# Patient Record
Sex: Female | Born: 1945
Health system: Southern US, Community
[De-identification: ages and names within clinical notes are randomized; demographics above are authoritative.]

## PROBLEM LIST (undated history)

## (undated) DIAGNOSIS — I10 Essential (primary) hypertension: Secondary | ICD-10-CM

## (undated) HISTORY — PX: ABDOMINAL HYSTERECTOMY: SHX81

## (undated) HISTORY — DX: Essential (primary) hypertension: I10

## (undated) HISTORY — PX: PARTIAL HYSTERECTOMY: SHX80

---

## 1998-10-01 ENCOUNTER — Other Ambulatory Visit: Admission: RE | Admit: 1998-10-01 | Discharge: 1998-10-01 | Payer: Self-pay | Admitting: Obstetrics & Gynecology

## 1999-11-07 ENCOUNTER — Other Ambulatory Visit: Admission: RE | Admit: 1999-11-07 | Discharge: 1999-11-07 | Payer: Self-pay | Admitting: Obstetrics & Gynecology

## 2000-09-14 ENCOUNTER — Encounter: Admission: RE | Admit: 2000-09-14 | Discharge: 2000-09-14 | Payer: Self-pay | Admitting: Family Medicine

## 2000-09-14 ENCOUNTER — Encounter: Payer: Self-pay | Admitting: Family Medicine

## 2000-12-25 ENCOUNTER — Encounter: Admission: RE | Admit: 2000-12-25 | Discharge: 2000-12-25 | Payer: Self-pay | Admitting: Family Medicine

## 2000-12-25 ENCOUNTER — Encounter: Payer: Self-pay | Admitting: Family Medicine

## 2001-01-09 ENCOUNTER — Other Ambulatory Visit: Admission: RE | Admit: 2001-01-09 | Discharge: 2001-01-09 | Payer: Self-pay | Admitting: Obstetrics & Gynecology

## 2001-02-28 ENCOUNTER — Encounter: Admission: RE | Admit: 2001-02-28 | Discharge: 2001-02-28 | Payer: Self-pay | Admitting: Gastroenterology

## 2001-02-28 ENCOUNTER — Encounter: Payer: Self-pay | Admitting: Gastroenterology

## 2001-06-17 ENCOUNTER — Encounter: Payer: Self-pay | Admitting: *Deleted

## 2001-06-17 ENCOUNTER — Encounter: Admission: RE | Admit: 2001-06-17 | Discharge: 2001-06-17 | Payer: Self-pay | Admitting: *Deleted

## 2002-02-24 ENCOUNTER — Encounter: Payer: Self-pay | Admitting: Obstetrics & Gynecology

## 2002-02-24 ENCOUNTER — Encounter: Admission: RE | Admit: 2002-02-24 | Discharge: 2002-02-24 | Payer: Self-pay | Admitting: Obstetrics & Gynecology

## 2002-03-06 ENCOUNTER — Other Ambulatory Visit: Admission: RE | Admit: 2002-03-06 | Discharge: 2002-03-06 | Payer: Self-pay | Admitting: Obstetrics & Gynecology

## 2003-01-07 ENCOUNTER — Ambulatory Visit (HOSPITAL_COMMUNITY): Admission: RE | Admit: 2003-01-07 | Discharge: 2003-01-07 | Payer: Self-pay | Admitting: Gastroenterology

## 2003-07-29 ENCOUNTER — Encounter: Admission: RE | Admit: 2003-07-29 | Discharge: 2003-07-29 | Payer: Self-pay | Admitting: Obstetrics & Gynecology

## 2003-07-29 ENCOUNTER — Encounter: Payer: Self-pay | Admitting: Obstetrics & Gynecology

## 2003-09-25 ENCOUNTER — Other Ambulatory Visit: Admission: RE | Admit: 2003-09-25 | Discharge: 2003-09-25 | Payer: Self-pay | Admitting: Obstetrics & Gynecology

## 2004-12-07 ENCOUNTER — Other Ambulatory Visit: Admission: RE | Admit: 2004-12-07 | Discharge: 2004-12-07 | Payer: Self-pay | Admitting: Obstetrics & Gynecology

## 2006-02-26 ENCOUNTER — Encounter: Admission: RE | Admit: 2006-02-26 | Discharge: 2006-02-26 | Payer: Self-pay | Admitting: Obstetrics & Gynecology

## 2006-03-06 ENCOUNTER — Other Ambulatory Visit: Admission: RE | Admit: 2006-03-06 | Discharge: 2006-03-06 | Payer: Self-pay | Admitting: Obstetrics & Gynecology

## 2006-03-20 ENCOUNTER — Encounter: Admission: RE | Admit: 2006-03-20 | Discharge: 2006-03-20 | Payer: Self-pay | Admitting: Obstetrics & Gynecology

## 2006-10-01 ENCOUNTER — Encounter: Admission: RE | Admit: 2006-10-01 | Discharge: 2006-10-01 | Payer: Self-pay | Admitting: Family Medicine

## 2007-07-08 ENCOUNTER — Encounter: Admission: RE | Admit: 2007-07-08 | Discharge: 2007-07-08 | Payer: Self-pay | Admitting: Family Medicine

## 2008-06-16 ENCOUNTER — Encounter: Admission: RE | Admit: 2008-06-16 | Discharge: 2008-06-16 | Payer: Self-pay | Admitting: Family Medicine

## 2009-03-03 ENCOUNTER — Encounter: Admission: RE | Admit: 2009-03-03 | Discharge: 2009-03-03 | Payer: Self-pay | Admitting: Obstetrics & Gynecology

## 2009-05-07 ENCOUNTER — Encounter: Admission: RE | Admit: 2009-05-07 | Discharge: 2009-05-07 | Payer: Self-pay | Admitting: Family Medicine

## 2009-06-16 ENCOUNTER — Encounter: Admission: RE | Admit: 2009-06-16 | Discharge: 2009-06-16 | Payer: Self-pay | Admitting: Obstetrics & Gynecology

## 2009-11-24 ENCOUNTER — Encounter: Admission: RE | Admit: 2009-11-24 | Discharge: 2009-11-24 | Payer: Self-pay | Admitting: Family Medicine

## 2010-06-07 ENCOUNTER — Encounter: Admission: RE | Admit: 2010-06-07 | Discharge: 2010-06-07 | Payer: Self-pay | Admitting: Family Medicine

## 2010-06-09 ENCOUNTER — Encounter: Admission: RE | Admit: 2010-06-09 | Discharge: 2010-06-09 | Payer: Self-pay | Admitting: Family Medicine

## 2010-12-18 ENCOUNTER — Encounter: Payer: Self-pay | Admitting: Obstetrics & Gynecology

## 2011-04-14 NOTE — Op Note (Signed)
NAME:  Jenny Mitchell, Jenny Mitchell                    ACCOUNT NO.:  [id number]   MEDICAL RECORD NO.:  [id number]                   PATIENT TYPE:  AMB   LOCATION:  ENDO                                 FACILITY:  [facility]   PHYSICIAN:  Anselmo Rod, M.D.               DATE OF BIRTH:  09-30-1946   DATE OF PROCEDURE:  01/07/2003  DATE OF DISCHARGE:                                 OPERATIVE REPORT   PROCEDURE PERFORMED:  Colonoscopy.   ENDOSCOPIST:  Charna Elizabeth, M.D.   INSTRUMENT USED:  Olympus video colonoscope (adjustable pediatric scope).   INDICATIONS FOR PROCEDURE:  The patient is a 65 year old African-American  female with a family history of colon cancer in her father and polyps in her  sister.  Rule out colonic polyps, masses, etc.   PREPROCEDURE PREPARATION:  Informed consent was procured from the patient.  The patient was fasted for eight hours prior to the procedure and prepped  with a bottle of magnesium citrate and a gallon of GoLYTELY the night prior  to the procedure.   PREPROCEDURE PHYSICAL:  The patient had stable vital signs.  Neck supple.  Chest clear to auscultation.  S1 and S2 regular.  Abdomen soft with normal  bowel sounds.   DESCRIPTION OF PROCEDURE:  The patient was placed in left lateral decubitus  position and sedated with an additional 20 mg of Demerol and 2 mg of Versed  intravenously.  Once the patient was adequately sedated and maintained on  low flow oxygen and continuous cardiac monitoring, the Olympus video  colonoscope was advanced from the rectum to the cecum without difficulty.  The entire colonic mucosa appeared healthy with a normal vascular pattern.  No masses, erosions, polyps, or ulcerations were seen and there was no  evidence of diverticulosis.  Small internal hemorrhoids were noticed on  retroflexion.  A small external hemorrhoid was seen on anal inspection.  The  patient tolerated the procedure well without complications.  No masses or  polyps were seen.   IMPRESSION:  Normal colonoscopy except for small nonbleeding internal  hemorrhoids and a small external hemorrhoid.   RECOMMENDATIONS:  1. A high fiber diet with liberal fluid intake has been advocated.  2.     Repeat colorectal cancer screening is recommended in the next five years     unless the patient develops any abnormal symptoms in the interim.  3. Outpatient follow-up in the next two weeks for further recommendations.                                                    Anselmo Rod, M.D.    JNM/MEDQ  D:  01/07/2003  T:  01/07/2003  Job:  [id number]   cc:   Juline Patch, M.D.  [address] [place]  [place], [place] [address]  Fax: [phone]

## 2011-04-14 NOTE — Op Note (Signed)
   NAME:  Jenny Mitchell, Jenny Mitchell                    ACCOUNT NO.:  [id number]   MEDICAL RECORD NO.:  [id number]                   PATIENT TYPE:  AMB   LOCATION:  ENDO                                 FACILITY:  [facility]   PHYSICIAN:  Anselmo Rod, M.D.               DATE OF BIRTH:  August 04, 1946   DATE OF PROCEDURE:  DATE OF DISCHARGE:                                 OPERATIVE REPORT   PROCEDURE PERFORMED:  Esophagogastroduodenoscopy.   ENDOSCOPIST:  Charna Elizabeth, M.D.   INSTRUMENT USED:  Olympus video pan endoscope.   INDICATIONS FOR PROCEDURE:  Epigastric pain ina  65 year old, African-  American female, rule out peptic ulcer disease, esophagitis, gastritis, etc.   PRE-PROCEDURE PREPARATION:  Informed consent was secured from the patient.  The patient fasted for eight hours prior to the procedure.   PRE-PROCEDURE PHYSICAL:  The patient has stable vital signs.  The neck was  supple.  Chest was clear to auscultation.  S1 and S2 regular.  Abdomen:  Soft, but normal bowel sounds.   DESCRIPTION OF PROCEDURE:  The patient was placed in left lateral decubitus  position, sedated with 50 mg of Demerol and 5 mg of Versed intravenously.  Once the patient was adequately sedated and maintained on low flow oxygen,  continuous cardiac monitoring, the Olympus video pan endoscope was advanced  through the mouth piece, over the tongue, into the esophagus under direct  vision.  The entire esophagus appeared normal with no evidence of ring  stricture, masses, esophagitis or varus.  The colonoscope was then advanced  into the stomach.  The entire gastric mucosa and proximal stomach appeared  normal.   IMPRESSION:  Normal EGD.   RECOMMENDATIONS:  Proceed with colonoscopy at this time.                                               Anselmo Rod, M.D.    JNM/MEDQ  D:  01/07/2003  T:  01/07/2003  Job:  [id number]   cc:   Juline Patch, M.D.  [address]  [place], [place] [address]  Fax:  [phone]

## 2011-07-04 ENCOUNTER — Other Ambulatory Visit: Payer: Self-pay | Admitting: Family Medicine

## 2011-07-04 DIAGNOSIS — Z1231 Encounter for screening mammogram for malignant neoplasm of breast: Secondary | ICD-10-CM

## 2011-07-11 ENCOUNTER — Ambulatory Visit
Admission: RE | Admit: 2011-07-11 | Discharge: 2011-07-11 | Disposition: A | Discharge status: Home / Self Care | Payer: Federal, State, Local not specified - PPO | Source: Ambulatory Visit | Attending: Family Medicine | Admitting: Family Medicine

## 2011-07-11 DIAGNOSIS — Z1231 Encounter for screening mammogram for malignant neoplasm of breast: Secondary | ICD-10-CM

## 2011-07-13 ENCOUNTER — Other Ambulatory Visit: Payer: Self-pay | Admitting: Family Medicine

## 2011-07-13 DIAGNOSIS — R928 Other abnormal and inconclusive findings on diagnostic imaging of breast: Secondary | ICD-10-CM

## 2011-07-21 ENCOUNTER — Ambulatory Visit
Admission: RE | Admit: 2011-07-21 | Discharge: 2011-07-21 | Disposition: A | Discharge status: Home / Self Care | Payer: Federal, State, Local not specified - PPO | Source: Ambulatory Visit | Attending: Family Medicine | Admitting: Family Medicine

## 2011-07-21 DIAGNOSIS — R928 Other abnormal and inconclusive findings on diagnostic imaging of breast: Secondary | ICD-10-CM

## 2011-11-25 ENCOUNTER — Ambulatory Visit (INDEPENDENT_AMBULATORY_CARE_PROVIDER_SITE_OTHER): Payer: Federal, State, Local not specified - PPO

## 2011-11-25 DIAGNOSIS — M542 Cervicalgia: Secondary | ICD-10-CM

## 2011-11-25 DIAGNOSIS — R5383 Other fatigue: Secondary | ICD-10-CM

## 2011-11-25 DIAGNOSIS — R51 Headache: Secondary | ICD-10-CM

## 2011-11-25 DIAGNOSIS — H02409 Unspecified ptosis of unspecified eyelid: Secondary | ICD-10-CM

## 2011-11-25 DIAGNOSIS — R5381 Other malaise: Secondary | ICD-10-CM

## 2011-11-29 DIAGNOSIS — R51 Headache: Secondary | ICD-10-CM | POA: Diagnosis not present

## 2012-01-03 DIAGNOSIS — M722 Plantar fascial fibromatosis: Secondary | ICD-10-CM | POA: Diagnosis not present

## 2012-01-26 DIAGNOSIS — M159 Polyosteoarthritis, unspecified: Secondary | ICD-10-CM | POA: Diagnosis not present

## 2012-01-26 DIAGNOSIS — I10 Essential (primary) hypertension: Secondary | ICD-10-CM | POA: Diagnosis not present

## 2012-02-15 DIAGNOSIS — H35379 Puckering of macula, unspecified eye: Secondary | ICD-10-CM | POA: Diagnosis not present

## 2012-02-15 DIAGNOSIS — D492 Neoplasm of unspecified behavior of bone, soft tissue, and skin: Secondary | ICD-10-CM | POA: Diagnosis not present

## 2012-02-15 DIAGNOSIS — H26499 Other secondary cataract, unspecified eye: Secondary | ICD-10-CM | POA: Diagnosis not present

## 2012-02-15 DIAGNOSIS — H40019 Open angle with borderline findings, low risk, unspecified eye: Secondary | ICD-10-CM | POA: Diagnosis not present

## 2012-08-06 DIAGNOSIS — Z23 Encounter for immunization: Secondary | ICD-10-CM | POA: Diagnosis not present

## 2012-08-06 DIAGNOSIS — I10 Essential (primary) hypertension: Secondary | ICD-10-CM | POA: Diagnosis not present

## 2012-08-06 DIAGNOSIS — M722 Plantar fascial fibromatosis: Secondary | ICD-10-CM | POA: Diagnosis not present

## 2012-08-06 DIAGNOSIS — K219 Gastro-esophageal reflux disease without esophagitis: Secondary | ICD-10-CM | POA: Diagnosis not present

## 2012-08-06 DIAGNOSIS — M159 Polyosteoarthritis, unspecified: Secondary | ICD-10-CM | POA: Diagnosis not present

## 2012-09-10 DIAGNOSIS — Z1212 Encounter for screening for malignant neoplasm of rectum: Secondary | ICD-10-CM | POA: Diagnosis not present

## 2012-09-10 DIAGNOSIS — R000 Tachycardia, unspecified: Secondary | ICD-10-CM | POA: Diagnosis not present

## 2012-09-10 DIAGNOSIS — I10 Essential (primary) hypertension: Secondary | ICD-10-CM | POA: Diagnosis not present

## 2012-09-10 DIAGNOSIS — Z124 Encounter for screening for malignant neoplasm of cervix: Secondary | ICD-10-CM | POA: Diagnosis not present

## 2012-09-10 DIAGNOSIS — R809 Proteinuria, unspecified: Secondary | ICD-10-CM | POA: Diagnosis not present

## 2012-09-10 DIAGNOSIS — K219 Gastro-esophageal reflux disease without esophagitis: Secondary | ICD-10-CM | POA: Diagnosis not present

## 2012-09-11 ENCOUNTER — Other Ambulatory Visit: Payer: Self-pay | Admitting: Obstetrics & Gynecology

## 2012-09-11 DIAGNOSIS — Z1231 Encounter for screening mammogram for malignant neoplasm of breast: Secondary | ICD-10-CM

## 2012-10-04 ENCOUNTER — Ambulatory Visit
Admission: RE | Admit: 2012-10-04 | Discharge: 2012-10-04 | Disposition: A | Discharge status: Home / Self Care | Payer: Medicare Other | Source: Ambulatory Visit | Attending: Obstetrics & Gynecology | Admitting: Obstetrics & Gynecology

## 2012-10-04 DIAGNOSIS — Z1231 Encounter for screening mammogram for malignant neoplasm of breast: Secondary | ICD-10-CM | POA: Diagnosis not present

## 2012-10-09 ENCOUNTER — Other Ambulatory Visit: Payer: Self-pay | Admitting: Obstetrics & Gynecology

## 2012-10-09 DIAGNOSIS — R928 Other abnormal and inconclusive findings on diagnostic imaging of breast: Secondary | ICD-10-CM

## 2012-10-21 ENCOUNTER — Ambulatory Visit
Admission: RE | Admit: 2012-10-21 | Discharge: 2012-10-21 | Disposition: A | Discharge status: Home / Self Care | Payer: Medicare Other | Source: Ambulatory Visit | Attending: Obstetrics & Gynecology | Admitting: Obstetrics & Gynecology

## 2012-10-21 DIAGNOSIS — N6019 Diffuse cystic mastopathy of unspecified breast: Secondary | ICD-10-CM | POA: Diagnosis not present

## 2012-10-21 DIAGNOSIS — R928 Other abnormal and inconclusive findings on diagnostic imaging of breast: Secondary | ICD-10-CM

## 2012-12-30 DIAGNOSIS — Z961 Presence of intraocular lens: Secondary | ICD-10-CM | POA: Diagnosis not present

## 2012-12-30 DIAGNOSIS — H43819 Vitreous degeneration, unspecified eye: Secondary | ICD-10-CM | POA: Diagnosis not present

## 2012-12-30 DIAGNOSIS — H40019 Open angle with borderline findings, low risk, unspecified eye: Secondary | ICD-10-CM | POA: Diagnosis not present

## 2012-12-30 DIAGNOSIS — H35379 Puckering of macula, unspecified eye: Secondary | ICD-10-CM | POA: Diagnosis not present

## 2012-12-30 DIAGNOSIS — H26499 Other secondary cataract, unspecified eye: Secondary | ICD-10-CM | POA: Diagnosis not present

## 2013-02-03 DIAGNOSIS — I10 Essential (primary) hypertension: Secondary | ICD-10-CM | POA: Diagnosis not present

## 2013-02-03 DIAGNOSIS — M722 Plantar fascial fibromatosis: Secondary | ICD-10-CM | POA: Diagnosis not present

## 2013-02-03 DIAGNOSIS — M159 Polyosteoarthritis, unspecified: Secondary | ICD-10-CM | POA: Diagnosis not present

## 2013-02-03 DIAGNOSIS — K219 Gastro-esophageal reflux disease without esophagitis: Secondary | ICD-10-CM | POA: Diagnosis not present

## 2013-02-17 DIAGNOSIS — H01009 Unspecified blepharitis unspecified eye, unspecified eyelid: Secondary | ICD-10-CM | POA: Diagnosis not present

## 2013-02-17 DIAGNOSIS — H43819 Vitreous degeneration, unspecified eye: Secondary | ICD-10-CM | POA: Diagnosis not present

## 2013-02-17 DIAGNOSIS — H26499 Other secondary cataract, unspecified eye: Secondary | ICD-10-CM | POA: Diagnosis not present

## 2013-02-17 DIAGNOSIS — H40019 Open angle with borderline findings, low risk, unspecified eye: Secondary | ICD-10-CM | POA: Diagnosis not present

## 2013-02-17 DIAGNOSIS — H35039 Hypertensive retinopathy, unspecified eye: Secondary | ICD-10-CM | POA: Diagnosis not present

## 2013-08-04 DIAGNOSIS — H1045 Other chronic allergic conjunctivitis: Secondary | ICD-10-CM | POA: Diagnosis not present

## 2013-08-04 DIAGNOSIS — H01009 Unspecified blepharitis unspecified eye, unspecified eyelid: Secondary | ICD-10-CM | POA: Diagnosis not present

## 2013-08-04 DIAGNOSIS — H40019 Open angle with borderline findings, low risk, unspecified eye: Secondary | ICD-10-CM | POA: Diagnosis not present

## 2013-08-06 DIAGNOSIS — R42 Dizziness and giddiness: Secondary | ICD-10-CM | POA: Diagnosis not present

## 2013-08-06 DIAGNOSIS — M159 Polyosteoarthritis, unspecified: Secondary | ICD-10-CM | POA: Diagnosis not present

## 2013-08-06 DIAGNOSIS — I10 Essential (primary) hypertension: Secondary | ICD-10-CM | POA: Diagnosis not present

## 2013-08-06 DIAGNOSIS — R809 Proteinuria, unspecified: Secondary | ICD-10-CM | POA: Diagnosis not present

## 2013-08-06 DIAGNOSIS — M722 Plantar fascial fibromatosis: Secondary | ICD-10-CM | POA: Diagnosis not present

## 2013-09-09 DIAGNOSIS — Z800 Family history of malignant neoplasm of digestive organs: Secondary | ICD-10-CM | POA: Diagnosis not present

## 2013-09-09 DIAGNOSIS — R197 Diarrhea, unspecified: Secondary | ICD-10-CM | POA: Diagnosis not present

## 2013-09-09 DIAGNOSIS — R141 Gas pain: Secondary | ICD-10-CM | POA: Diagnosis not present

## 2013-09-09 DIAGNOSIS — Z1211 Encounter for screening for malignant neoplasm of colon: Secondary | ICD-10-CM | POA: Diagnosis not present

## 2013-11-03 DIAGNOSIS — M810 Age-related osteoporosis without current pathological fracture: Secondary | ICD-10-CM | POA: Diagnosis not present

## 2013-11-03 DIAGNOSIS — Z1231 Encounter for screening mammogram for malignant neoplasm of breast: Secondary | ICD-10-CM | POA: Diagnosis not present

## 2013-11-03 DIAGNOSIS — Z01419 Encounter for gynecological examination (general) (routine) without abnormal findings: Secondary | ICD-10-CM | POA: Diagnosis not present

## 2013-11-03 DIAGNOSIS — N959 Unspecified menopausal and perimenopausal disorder: Secondary | ICD-10-CM | POA: Diagnosis not present

## 2013-11-10 DIAGNOSIS — Z1211 Encounter for screening for malignant neoplasm of colon: Secondary | ICD-10-CM | POA: Diagnosis not present

## 2013-11-10 DIAGNOSIS — D126 Benign neoplasm of colon, unspecified: Secondary | ICD-10-CM | POA: Diagnosis not present

## 2013-11-10 DIAGNOSIS — Z800 Family history of malignant neoplasm of digestive organs: Secondary | ICD-10-CM | POA: Diagnosis not present

## 2013-12-11 DIAGNOSIS — R7401 Elevation of levels of liver transaminase levels: Secondary | ICD-10-CM | POA: Diagnosis not present

## 2014-02-05 DIAGNOSIS — I10 Essential (primary) hypertension: Secondary | ICD-10-CM | POA: Diagnosis not present

## 2014-02-05 DIAGNOSIS — M722 Plantar fascial fibromatosis: Secondary | ICD-10-CM | POA: Diagnosis not present

## 2014-08-13 DIAGNOSIS — M722 Plantar fascial fibromatosis: Secondary | ICD-10-CM | POA: Diagnosis not present

## 2014-08-13 DIAGNOSIS — I10 Essential (primary) hypertension: Secondary | ICD-10-CM | POA: Diagnosis not present

## 2014-08-13 DIAGNOSIS — R809 Proteinuria, unspecified: Secondary | ICD-10-CM | POA: Diagnosis not present

## 2014-11-24 DIAGNOSIS — Z1231 Encounter for screening mammogram for malignant neoplasm of breast: Secondary | ICD-10-CM | POA: Diagnosis not present

## 2014-11-24 DIAGNOSIS — Z124 Encounter for screening for malignant neoplasm of cervix: Secondary | ICD-10-CM | POA: Diagnosis not present

## 2014-11-24 DIAGNOSIS — Z01419 Encounter for gynecological examination (general) (routine) without abnormal findings: Secondary | ICD-10-CM | POA: Diagnosis not present

## 2014-11-24 DIAGNOSIS — Z1272 Encounter for screening for malignant neoplasm of vagina: Secondary | ICD-10-CM | POA: Diagnosis not present

## 2015-02-11 DIAGNOSIS — M722 Plantar fascial fibromatosis: Secondary | ICD-10-CM | POA: Diagnosis not present

## 2015-02-11 DIAGNOSIS — I10 Essential (primary) hypertension: Secondary | ICD-10-CM | POA: Diagnosis not present

## 2015-02-11 DIAGNOSIS — Z23 Encounter for immunization: Secondary | ICD-10-CM | POA: Diagnosis not present

## 2015-02-11 DIAGNOSIS — R809 Proteinuria, unspecified: Secondary | ICD-10-CM | POA: Diagnosis not present

## 2015-02-11 DIAGNOSIS — M810 Age-related osteoporosis without current pathological fracture: Secondary | ICD-10-CM | POA: Diagnosis not present

## 2015-06-15 DIAGNOSIS — H01003 Unspecified blepharitis right eye, unspecified eyelid: Secondary | ICD-10-CM | POA: Diagnosis not present

## 2015-06-15 DIAGNOSIS — Z961 Presence of intraocular lens: Secondary | ICD-10-CM | POA: Diagnosis not present

## 2015-06-15 DIAGNOSIS — H40013 Open angle with borderline findings, low risk, bilateral: Secondary | ICD-10-CM | POA: Diagnosis not present

## 2015-06-15 DIAGNOSIS — H35033 Hypertensive retinopathy, bilateral: Secondary | ICD-10-CM | POA: Diagnosis not present

## 2015-08-26 DIAGNOSIS — R809 Proteinuria, unspecified: Secondary | ICD-10-CM | POA: Diagnosis not present

## 2015-08-26 DIAGNOSIS — I10 Essential (primary) hypertension: Secondary | ICD-10-CM | POA: Diagnosis not present

## 2015-08-26 DIAGNOSIS — M810 Age-related osteoporosis without current pathological fracture: Secondary | ICD-10-CM | POA: Diagnosis not present

## 2015-08-26 DIAGNOSIS — M722 Plantar fascial fibromatosis: Secondary | ICD-10-CM | POA: Diagnosis not present

## 2015-10-07 DIAGNOSIS — H0289 Other specified disorders of eyelid: Secondary | ICD-10-CM | POA: Diagnosis not present

## 2015-10-07 DIAGNOSIS — H40013 Open angle with borderline findings, low risk, bilateral: Secondary | ICD-10-CM | POA: Diagnosis not present

## 2015-12-07 DIAGNOSIS — Z1231 Encounter for screening mammogram for malignant neoplasm of breast: Secondary | ICD-10-CM | POA: Diagnosis not present

## 2015-12-07 DIAGNOSIS — Z01419 Encounter for gynecological examination (general) (routine) without abnormal findings: Secondary | ICD-10-CM | POA: Diagnosis not present

## 2015-12-07 DIAGNOSIS — Z6822 Body mass index (BMI) 22.0-22.9, adult: Secondary | ICD-10-CM | POA: Diagnosis not present

## 2016-01-06 DIAGNOSIS — J069 Acute upper respiratory infection, unspecified: Secondary | ICD-10-CM | POA: Diagnosis not present

## 2016-01-19 ENCOUNTER — Ambulatory Visit
Admission: RE | Admit: 2016-01-19 | Discharge: 2016-01-19 | Disposition: A | Discharge status: Home / Self Care | Payer: Medicare Other | Source: Ambulatory Visit | Attending: Family Medicine | Admitting: Family Medicine

## 2016-01-19 ENCOUNTER — Other Ambulatory Visit: Payer: Self-pay | Admitting: Family Medicine

## 2016-01-19 DIAGNOSIS — R05 Cough: Secondary | ICD-10-CM | POA: Diagnosis not present

## 2016-01-19 DIAGNOSIS — R059 Cough, unspecified: Secondary | ICD-10-CM

## 2016-01-28 DIAGNOSIS — J209 Acute bronchitis, unspecified: Secondary | ICD-10-CM | POA: Diagnosis not present

## 2016-01-28 DIAGNOSIS — R079 Chest pain, unspecified: Secondary | ICD-10-CM | POA: Diagnosis not present

## 2016-03-24 DIAGNOSIS — M7710 Lateral epicondylitis, unspecified elbow: Secondary | ICD-10-CM | POA: Diagnosis not present

## 2016-03-24 DIAGNOSIS — I10 Essential (primary) hypertension: Secondary | ICD-10-CM | POA: Diagnosis not present

## 2016-03-24 DIAGNOSIS — R809 Proteinuria, unspecified: Secondary | ICD-10-CM | POA: Diagnosis not present

## 2016-03-24 DIAGNOSIS — M810 Age-related osteoporosis without current pathological fracture: Secondary | ICD-10-CM | POA: Diagnosis not present

## 2016-06-15 DIAGNOSIS — H40013 Open angle with borderline findings, low risk, bilateral: Secondary | ICD-10-CM | POA: Diagnosis not present

## 2016-06-15 DIAGNOSIS — Z961 Presence of intraocular lens: Secondary | ICD-10-CM | POA: Diagnosis not present

## 2016-06-15 DIAGNOSIS — H43821 Vitreomacular adhesion, right eye: Secondary | ICD-10-CM | POA: Diagnosis not present

## 2016-06-15 DIAGNOSIS — H01003 Unspecified blepharitis right eye, unspecified eyelid: Secondary | ICD-10-CM | POA: Diagnosis not present

## 2016-09-25 DIAGNOSIS — M722 Plantar fascial fibromatosis: Secondary | ICD-10-CM | POA: Diagnosis not present

## 2016-09-25 DIAGNOSIS — R809 Proteinuria, unspecified: Secondary | ICD-10-CM | POA: Diagnosis not present

## 2016-09-25 DIAGNOSIS — I10 Essential (primary) hypertension: Secondary | ICD-10-CM | POA: Diagnosis not present

## 2016-09-25 DIAGNOSIS — M810 Age-related osteoporosis without current pathological fracture: Secondary | ICD-10-CM | POA: Diagnosis not present

## 2016-12-07 DIAGNOSIS — N958 Other specified menopausal and perimenopausal disorders: Secondary | ICD-10-CM | POA: Diagnosis not present

## 2016-12-07 DIAGNOSIS — Z6822 Body mass index (BMI) 22.0-22.9, adult: Secondary | ICD-10-CM | POA: Diagnosis not present

## 2016-12-07 DIAGNOSIS — M8588 Other specified disorders of bone density and structure, other site: Secondary | ICD-10-CM | POA: Diagnosis not present

## 2016-12-07 DIAGNOSIS — Z01419 Encounter for gynecological examination (general) (routine) without abnormal findings: Secondary | ICD-10-CM | POA: Diagnosis not present

## 2016-12-07 DIAGNOSIS — Z1231 Encounter for screening mammogram for malignant neoplasm of breast: Secondary | ICD-10-CM | POA: Diagnosis not present

## 2016-12-21 DIAGNOSIS — R5383 Other fatigue: Secondary | ICD-10-CM | POA: Diagnosis not present

## 2017-03-29 DIAGNOSIS — I10 Essential (primary) hypertension: Secondary | ICD-10-CM | POA: Diagnosis not present

## 2017-03-29 DIAGNOSIS — M722 Plantar fascial fibromatosis: Secondary | ICD-10-CM | POA: Diagnosis not present

## 2017-03-29 DIAGNOSIS — M810 Age-related osteoporosis without current pathological fracture: Secondary | ICD-10-CM | POA: Diagnosis not present

## 2017-03-29 DIAGNOSIS — R809 Proteinuria, unspecified: Secondary | ICD-10-CM | POA: Diagnosis not present

## 2017-06-06 ENCOUNTER — Ambulatory Visit (INDEPENDENT_AMBULATORY_CARE_PROVIDER_SITE_OTHER): Payer: Medicare Other | Admitting: Physician Assistant

## 2017-06-06 ENCOUNTER — Encounter: Payer: Self-pay | Admitting: Physician Assistant

## 2017-06-06 VITALS — BP 135/78 | HR 89 | Temp 97.6°F | Resp 16 | Ht 66.0 in | Wt 134.8 lb

## 2017-06-06 DIAGNOSIS — M79662 Pain in left lower leg: Secondary | ICD-10-CM | POA: Diagnosis not present

## 2017-06-06 NOTE — Patient Instructions (Addendum)
We will contact you with your [place] results. Have a safe flight and enjoy your trip!    IF you received an x-ray today, you will receive an invoice from [facility]. Please contact [facility] at [phone] with questions or concerns regarding your invoice.   IF you received labwork today, you will receive an invoice from [facility]. Please contact [facility] at [phone] with questions or concerns regarding your invoice.   Our billing staff will not be able to assist you with questions regarding bills from these companies.  You will be contacted with the lab results as soon as they are available. The fastest way to get your results is to activate your My Chart account. Instructions are located on the last page of this paperwork. If you have not heard from [place] regarding the results in 2 weeks, please contact this office.

## 2017-06-06 NOTE — Progress Notes (Signed)
Jenny Mitchell  MRN: [id number] DOB: 1946-06-24  Subjective:  Jenny Mitchell is a 71 y.o. female who presents for evaluation of pain of the left leg. Symptoms have been present for 2 days. Described as an aching pulsating pain. Occurs down the entire left leg. She has had similar problems in the past. The patient is able to ambulate. She is very active, exercises daily. Denies swelling, redness, and warmth. Risk factors for hypercoagulable state include:none. Denies hx of cancer, family history, hormone replacement therapy, obesity, oral contraceptives, prior venous thromboembolism, prolonged sitting during travel, recent hospitalization, recent immobilization, recent surgery, date n/a and recent trauma. She is concerned because she is going on a 10 hour flight tomorrow night. She has tried aspirin with no relief.   Review of Systems  Constitutional: Negative for chills, diaphoresis and fever.  Respiratory: Negative for cough and shortness of breath.   Cardiovascular: Negative for leg swelling.  Neurological: Negative for dizziness and light-headedness.    There are no active problems to display for this patient.   No current outpatient prescriptions on file prior to visit.   No current facility-administered medications on file prior to visit.     No Known Allergies     Social History   Social History  . Marital status: Married    Spouse name: N/A  . Number of children: N/A  . Years of education: N/A   Occupational History  . Not on file.   Social History Main Topics  . Smoking status: Never Smoker  . Smokeless tobacco: Never Used  . Alcohol use No  . Drug use: No  . Sexual activity: Not on file   Other Topics Concern  . Not on file   Social History Narrative  . No narrative on file    Objective:  BP 135/78   Pulse 89   Temp 97.6 F (36.4 C) (Oral)   Resp 16   Ht 5\' 6"  (1.676 m)   Wt 134 lb 12.8 oz (61.1 kg)   SpO2 99%   BMI 21.76 kg/m    Physical Exam  Constitutional: She is oriented to person, place, and time and well-developed, well-nourished, and in no distress.  HENT:  Head: Normocephalic and atraumatic.  Eyes: Conjunctivae are normal.  Neck: Normal range of motion.  Cardiovascular: Normal rate, regular rhythm and normal heart sounds.   Pulses:      Popliteal pulses are 2+ on the right side, and 2+ on the left side.       Dorsalis pedis pulses are 2+ on the right side, and 2+ on the left side.       Posterior tibial pulses are 2+ on the right side, and 2+ on the left side.  Pulmonary/Chest: Effort normal and breath sounds normal.  Musculoskeletal:       Right knee: Normal.       Left knee: Normal.       Right ankle: Normal.       Left ankle: Normal.       Right lower leg: Normal.       Left lower leg: She exhibits tenderness (with palpation of calf) and deformity (~4 by 2 cm ecchymosis noted on medial aspect of left lower extremity  ). She exhibits no swelling and no edema.  No erythema or warmth noted in bilateral lower extremities.  Calf measures 32 cm bilaterally.   Neurological: She is alert and oriented to person, place, and time. Gait normal.  Skin: Skin is  warm and dry.  Psychiatric: Affect normal.  Vitals reviewed.   Assessment and Plan :  1. Pain in left lower leg Due to hx and PE findings, will schedule LE [place] prior to 10 hour flight tomorrow night. Pt informed that I will contact her with results. Seek care immediately if any of her symptoms worsen or she develops swelling and redness while awaiting [place].  - VAS [place] LOWER EXTREMITY VENOUS (DVT); Future   Tenna Delaine PA-C  [facility] 06/06/2017 1:35 PM

## 2017-06-07 ENCOUNTER — Ambulatory Visit (HOSPITAL_COMMUNITY)
Admission: RE | Admit: 2017-06-07 | Discharge: 2017-06-07 | Disposition: A | Discharge status: Home / Self Care | Payer: Medicare Other | Source: Ambulatory Visit | Attending: Physician Assistant | Admitting: Physician Assistant

## 2017-06-07 DIAGNOSIS — M79662 Pain in left lower leg: Secondary | ICD-10-CM | POA: Insufficient documentation

## 2017-06-07 NOTE — Progress Notes (Signed)
VASCULAR LAB PRELIMINARY  PRELIMINARY  PRELIMINARY  PRELIMINARY  Left lower extremity venous duplex completed.    Preliminary report:  Left:  No evidence of DVT, superficial thrombosis, or Baker's cyst.  Jenny Mitchell, RVS 06/07/2017, 10:40 AM

## 2017-09-07 DIAGNOSIS — H40013 Open angle with borderline findings, low risk, bilateral: Secondary | ICD-10-CM | POA: Diagnosis not present

## 2017-09-07 DIAGNOSIS — H26493 Other secondary cataract, bilateral: Secondary | ICD-10-CM | POA: Diagnosis not present

## 2017-09-07 DIAGNOSIS — Z961 Presence of intraocular lens: Secondary | ICD-10-CM | POA: Diagnosis not present

## 2017-09-07 DIAGNOSIS — H35033 Hypertensive retinopathy, bilateral: Secondary | ICD-10-CM | POA: Diagnosis not present

## 2017-10-10 DIAGNOSIS — Z136 Encounter for screening for cardiovascular disorders: Secondary | ICD-10-CM | POA: Diagnosis not present

## 2017-10-10 DIAGNOSIS — R809 Proteinuria, unspecified: Secondary | ICD-10-CM | POA: Diagnosis not present

## 2017-10-10 DIAGNOSIS — M810 Age-related osteoporosis without current pathological fracture: Secondary | ICD-10-CM | POA: Diagnosis not present

## 2017-10-10 DIAGNOSIS — L309 Dermatitis, unspecified: Secondary | ICD-10-CM | POA: Diagnosis not present

## 2017-10-10 DIAGNOSIS — I10 Essential (primary) hypertension: Secondary | ICD-10-CM | POA: Diagnosis not present

## 2018-04-04 DIAGNOSIS — Z124 Encounter for screening for malignant neoplasm of cervix: Secondary | ICD-10-CM | POA: Diagnosis not present

## 2018-04-04 DIAGNOSIS — Z1272 Encounter for screening for malignant neoplasm of vagina: Secondary | ICD-10-CM | POA: Diagnosis not present

## 2018-04-04 DIAGNOSIS — Z1231 Encounter for screening mammogram for malignant neoplasm of breast: Secondary | ICD-10-CM | POA: Diagnosis not present

## 2018-04-04 DIAGNOSIS — Z6822 Body mass index (BMI) 22.0-22.9, adult: Secondary | ICD-10-CM | POA: Diagnosis not present

## 2018-04-04 DIAGNOSIS — Z01419 Encounter for gynecological examination (general) (routine) without abnormal findings: Secondary | ICD-10-CM | POA: Diagnosis not present

## 2018-04-12 DIAGNOSIS — H01003 Unspecified blepharitis right eye, unspecified eyelid: Secondary | ICD-10-CM | POA: Diagnosis not present

## 2018-04-12 DIAGNOSIS — H1013 Acute atopic conjunctivitis, bilateral: Secondary | ICD-10-CM | POA: Diagnosis not present

## 2018-04-12 DIAGNOSIS — H40013 Open angle with borderline findings, low risk, bilateral: Secondary | ICD-10-CM | POA: Diagnosis not present

## 2018-04-12 DIAGNOSIS — H0289 Other specified disorders of eyelid: Secondary | ICD-10-CM | POA: Diagnosis not present

## 2018-05-13 DIAGNOSIS — Z1159 Encounter for screening for other viral diseases: Secondary | ICD-10-CM | POA: Diagnosis not present

## 2018-05-13 DIAGNOSIS — M810 Age-related osteoporosis without current pathological fracture: Secondary | ICD-10-CM | POA: Diagnosis not present

## 2018-05-13 DIAGNOSIS — Z0000 Encounter for general adult medical examination without abnormal findings: Secondary | ICD-10-CM | POA: Diagnosis not present

## 2018-05-13 DIAGNOSIS — Z23 Encounter for immunization: Secondary | ICD-10-CM | POA: Diagnosis not present

## 2018-05-13 DIAGNOSIS — R809 Proteinuria, unspecified: Secondary | ICD-10-CM | POA: Diagnosis not present

## 2018-05-13 DIAGNOSIS — I10 Essential (primary) hypertension: Secondary | ICD-10-CM | POA: Diagnosis not present

## 2018-08-27 DIAGNOSIS — M419 Scoliosis, unspecified: Secondary | ICD-10-CM | POA: Diagnosis not present

## 2018-09-09 DIAGNOSIS — M545 Low back pain: Secondary | ICD-10-CM | POA: Diagnosis not present

## 2018-11-01 DIAGNOSIS — H40013 Open angle with borderline findings, low risk, bilateral: Secondary | ICD-10-CM | POA: Diagnosis not present

## 2018-11-01 DIAGNOSIS — H35033 Hypertensive retinopathy, bilateral: Secondary | ICD-10-CM | POA: Diagnosis not present

## 2018-11-01 DIAGNOSIS — H35361 Drusen (degenerative) of macula, right eye: Secondary | ICD-10-CM | POA: Diagnosis not present

## 2018-11-01 DIAGNOSIS — I7090 Unspecified atherosclerosis: Secondary | ICD-10-CM | POA: Diagnosis not present

## 2018-12-05 DIAGNOSIS — I10 Essential (primary) hypertension: Secondary | ICD-10-CM | POA: Diagnosis not present

## 2018-12-05 DIAGNOSIS — R809 Proteinuria, unspecified: Secondary | ICD-10-CM | POA: Diagnosis not present

## 2018-12-05 DIAGNOSIS — R252 Cramp and spasm: Secondary | ICD-10-CM | POA: Diagnosis not present

## 2018-12-05 DIAGNOSIS — M810 Age-related osteoporosis without current pathological fracture: Secondary | ICD-10-CM | POA: Diagnosis not present

## 2018-12-12 DIAGNOSIS — R194 Change in bowel habit: Secondary | ICD-10-CM | POA: Diagnosis not present

## 2018-12-12 DIAGNOSIS — K5730 Diverticulosis of large intestine without perforation or abscess without bleeding: Secondary | ICD-10-CM | POA: Diagnosis not present

## 2018-12-12 DIAGNOSIS — Z1211 Encounter for screening for malignant neoplasm of colon: Secondary | ICD-10-CM | POA: Diagnosis not present

## 2018-12-12 DIAGNOSIS — K219 Gastro-esophageal reflux disease without esophagitis: Secondary | ICD-10-CM | POA: Diagnosis not present

## 2018-12-12 DIAGNOSIS — Z86010 Personal history of colonic polyps: Secondary | ICD-10-CM | POA: Diagnosis not present

## 2018-12-12 DIAGNOSIS — Z800 Family history of malignant neoplasm of digestive organs: Secondary | ICD-10-CM | POA: Diagnosis not present

## 2019-02-03 DIAGNOSIS — D122 Benign neoplasm of ascending colon: Secondary | ICD-10-CM | POA: Diagnosis not present

## 2019-02-03 DIAGNOSIS — K635 Polyp of colon: Secondary | ICD-10-CM | POA: Diagnosis not present

## 2019-02-03 DIAGNOSIS — Z1211 Encounter for screening for malignant neoplasm of colon: Secondary | ICD-10-CM | POA: Diagnosis not present

## 2019-02-03 DIAGNOSIS — Z86010 Personal history of colonic polyps: Secondary | ICD-10-CM | POA: Diagnosis not present

## 2019-02-03 DIAGNOSIS — D120 Benign neoplasm of cecum: Secondary | ICD-10-CM | POA: Diagnosis not present

## 2019-02-03 DIAGNOSIS — Z800 Family history of malignant neoplasm of digestive organs: Secondary | ICD-10-CM | POA: Diagnosis not present

## 2019-04-22 MED FILL — LOSARTAN POTASSIUM 25 MG TA: 25 | 30 days supply | Qty: 30 | Fill #0

## 2019-05-19 ENCOUNTER — Other Ambulatory Visit: Payer: Self-pay | Admitting: *Deleted

## 2019-05-19 DIAGNOSIS — Z20822 Contact with and (suspected) exposure to covid-19: Secondary | ICD-10-CM

## 2019-05-25 LAB — NOVEL CORONAVIRUS, NAA: SARS-CoV-2, NAA: NOT DETECTED

## 2019-05-27 DIAGNOSIS — R809 Proteinuria, unspecified: Secondary | ICD-10-CM | POA: Diagnosis not present

## 2019-05-27 DIAGNOSIS — I10 Essential (primary) hypertension: Secondary | ICD-10-CM | POA: Diagnosis not present

## 2019-05-27 DIAGNOSIS — Z0000 Encounter for general adult medical examination without abnormal findings: Secondary | ICD-10-CM | POA: Diagnosis not present

## 2019-05-27 DIAGNOSIS — M810 Age-related osteoporosis without current pathological fracture: Secondary | ICD-10-CM | POA: Diagnosis not present

## 2019-07-14 DIAGNOSIS — Z961 Presence of intraocular lens: Secondary | ICD-10-CM | POA: Diagnosis not present

## 2019-07-14 DIAGNOSIS — H00022 Hordeolum internum right lower eyelid: Secondary | ICD-10-CM | POA: Diagnosis not present

## 2019-11-03 DIAGNOSIS — H35013 Changes in retinal vascular appearance, bilateral: Secondary | ICD-10-CM | POA: Diagnosis not present

## 2019-11-03 DIAGNOSIS — H35033 Hypertensive retinopathy, bilateral: Secondary | ICD-10-CM | POA: Diagnosis not present

## 2019-11-03 DIAGNOSIS — H40013 Open angle with borderline findings, low risk, bilateral: Secondary | ICD-10-CM | POA: Diagnosis not present

## 2019-11-03 DIAGNOSIS — H35361 Drusen (degenerative) of macula, right eye: Secondary | ICD-10-CM | POA: Diagnosis not present

## 2019-12-09 DIAGNOSIS — I10 Essential (primary) hypertension: Secondary | ICD-10-CM | POA: Diagnosis not present

## 2019-12-09 DIAGNOSIS — R809 Proteinuria, unspecified: Secondary | ICD-10-CM | POA: Diagnosis not present

## 2020-01-05 ENCOUNTER — Ambulatory Visit (INDEPENDENT_AMBULATORY_CARE_PROVIDER_SITE_OTHER): Payer: Medicare Other | Admitting: Podiatry

## 2020-01-05 ENCOUNTER — Encounter: Payer: Self-pay | Admitting: Podiatry

## 2020-01-05 ENCOUNTER — Other Ambulatory Visit: Payer: Self-pay

## 2020-01-05 ENCOUNTER — Other Ambulatory Visit: Payer: Self-pay | Admitting: Podiatry

## 2020-01-05 ENCOUNTER — Ambulatory Visit (INDEPENDENT_AMBULATORY_CARE_PROVIDER_SITE_OTHER): Payer: Medicare Other

## 2020-01-05 DIAGNOSIS — M7672 Peroneal tendinitis, left leg: Secondary | ICD-10-CM

## 2020-01-05 DIAGNOSIS — L600 Ingrowing nail: Secondary | ICD-10-CM

## 2020-01-05 DIAGNOSIS — M79672 Pain in left foot: Secondary | ICD-10-CM

## 2020-01-05 NOTE — Progress Notes (Signed)
Subjective:   Patient ID: Jenny Mitchell, female   DOB: 74 y.o.   MRN: [id number]   HPI Patient presents with several different problems with pain being noted in the outside of the left foot stating it is been hurting for at least a month and also having trouble with her big toenail that is been sore for a couple years.  Patient has had no history of bleeding associated with aggravated painful wear shoe gear and she tries to trim it.  Patient does not smoke likes to be active and does not remember injury   Review of Systems  All other systems reviewed and are negative.       Objective:  Physical Exam Vitals and nursing note reviewed.  Constitutional:      Appearance: She is well-developed.  Pulmonary:     Effort: Pulmonary effort is normal.  Musculoskeletal:        General: Normal range of motion.  Skin:    General: Skin is warm.  Neurological:     Mental Status: She is alert.     Neurovascular status was found to be intact muscle strength was found to be adequate range of motion within normal limits.  Patient is found to have quite a bit of discomfort in the outside of the left foot base of fifth metatarsal with inflammation fluid around the insertion of the tendon into the base of the fifth metatarsal with no indications of muscle strength loss.  The medial border left hallux is incurvated and moderately sore when pressed with no redness drainage noted     Assessment:  Peroneal tendinitis left at insertion base of fifth metatarsal along with ingrown toenail deformity agent     Plan:  P both conditions reviewed and today I did careful sheath injection left 3 mg Dexasone Kenalog 5 mg Xylocaine and applied fascial brace to lift up the lateral side of the foot and discussed possible ingrown toenail correction of the left big toenail at future date.  Reappoint 2 weeks  X-rays indicate that there is no signs of a bony injury base of fifth metatarsal left with no indications of  arthritis or other

## 2020-01-05 NOTE — Patient Instructions (Signed)
.  tfc  

## 2020-01-22 ENCOUNTER — Encounter: Payer: Self-pay | Admitting: Podiatry

## 2020-01-22 ENCOUNTER — Other Ambulatory Visit: Payer: Self-pay

## 2020-01-22 ENCOUNTER — Ambulatory Visit (INDEPENDENT_AMBULATORY_CARE_PROVIDER_SITE_OTHER): Payer: Medicare Other | Admitting: Podiatry

## 2020-01-22 VITALS — Temp 97.2°F

## 2020-01-22 DIAGNOSIS — L600 Ingrowing nail: Secondary | ICD-10-CM | POA: Diagnosis not present

## 2020-01-22 DIAGNOSIS — M7672 Peroneal tendinitis, left leg: Secondary | ICD-10-CM | POA: Diagnosis not present

## 2020-01-22 NOTE — Progress Notes (Signed)
Subjective:   Patient ID: Jenny Mitchell, female   DOB: 74 y.o.   MRN: [id number]   HPI Patient states overall seems to be improved but gets occasional discomfort especially during periods of laying in bed.  States that the bottom medial seems better in the arch and the back is mildly sore   ROS      Objective:  Physical Exam  Neurovascular status intact with patient's plantar fashion doing well with minimal discomfort with discomfort posterior of a mild nature with mild equinus     Assessment:  Improved fasciitis-like symptomatology with brace that I advised her on today with mild Achilles tendinitis left     Plan:  H&P reviewed both conditions and discussed continued exercises oral anti-inflammatory support therapy and recommended ice therapy for posterior heel and reappoint as symptoms indicate

## 2020-02-09 DIAGNOSIS — H26492 Other secondary cataract, left eye: Secondary | ICD-10-CM | POA: Diagnosis not present

## 2020-02-09 DIAGNOSIS — H35033 Hypertensive retinopathy, bilateral: Secondary | ICD-10-CM | POA: Diagnosis not present

## 2020-02-09 DIAGNOSIS — H35361 Drusen (degenerative) of macula, right eye: Secondary | ICD-10-CM | POA: Diagnosis not present

## 2020-02-09 DIAGNOSIS — R002 Palpitations: Secondary | ICD-10-CM | POA: Diagnosis not present

## 2020-02-09 DIAGNOSIS — H26493 Other secondary cataract, bilateral: Secondary | ICD-10-CM | POA: Diagnosis not present

## 2020-02-09 DIAGNOSIS — H40013 Open angle with borderline findings, low risk, bilateral: Secondary | ICD-10-CM | POA: Diagnosis not present

## 2020-02-09 DIAGNOSIS — H35371 Puckering of macula, right eye: Secondary | ICD-10-CM | POA: Diagnosis not present

## 2020-02-10 DIAGNOSIS — Z1231 Encounter for screening mammogram for malignant neoplasm of breast: Secondary | ICD-10-CM | POA: Diagnosis not present

## 2020-02-10 DIAGNOSIS — Z01419 Encounter for gynecological examination (general) (routine) without abnormal findings: Secondary | ICD-10-CM | POA: Diagnosis not present

## 2020-02-10 DIAGNOSIS — N958 Other specified menopausal and perimenopausal disorders: Secondary | ICD-10-CM | POA: Diagnosis not present

## 2020-02-10 DIAGNOSIS — M8588 Other specified disorders of bone density and structure, other site: Secondary | ICD-10-CM | POA: Diagnosis not present

## 2020-02-10 DIAGNOSIS — Z6821 Body mass index (BMI) 21.0-21.9, adult: Secondary | ICD-10-CM | POA: Diagnosis not present

## 2020-03-04 DIAGNOSIS — H26491 Other secondary cataract, right eye: Secondary | ICD-10-CM | POA: Diagnosis not present

## 2020-03-08 DIAGNOSIS — R002 Palpitations: Secondary | ICD-10-CM | POA: Diagnosis not present

## 2020-03-16 ENCOUNTER — Other Ambulatory Visit: Payer: Self-pay | Admitting: *Deleted

## 2020-03-16 ENCOUNTER — Telehealth: Payer: Self-pay | Admitting: General Practice

## 2020-03-16 ENCOUNTER — Encounter: Payer: Self-pay | Admitting: *Deleted

## 2020-03-16 DIAGNOSIS — R002 Palpitations: Secondary | ICD-10-CM

## 2020-03-16 NOTE — Telephone Encounter (Signed)
Patient enrolled for [employer] to send via [employer] a 3 day ZIO XT long term holter monitor.  Instructions will be included in her monitor kit.

## 2020-03-16 NOTE — Telephone Encounter (Signed)
New message:     Patient need a 48 monitor. Please call patient.

## 2020-03-22 ENCOUNTER — Ambulatory Visit (INDEPENDENT_AMBULATORY_CARE_PROVIDER_SITE_OTHER): Payer: Medicare Other

## 2020-03-22 DIAGNOSIS — R002 Palpitations: Secondary | ICD-10-CM | POA: Diagnosis not present

## 2020-04-05 ENCOUNTER — Telehealth: Payer: Self-pay | Admitting: General Practice

## 2020-04-05 NOTE — Telephone Encounter (Signed)
Jenny Mitchell called to see if her monitor she returned was received.

## 2020-04-08 DIAGNOSIS — R002 Palpitations: Secondary | ICD-10-CM | POA: Diagnosis not present

## 2020-04-16 DIAGNOSIS — H43393 Other vitreous opacities, bilateral: Secondary | ICD-10-CM | POA: Diagnosis not present

## 2020-04-16 DIAGNOSIS — H43813 Vitreous degeneration, bilateral: Secondary | ICD-10-CM | POA: Diagnosis not present

## 2020-06-02 DIAGNOSIS — Z23 Encounter for immunization: Secondary | ICD-10-CM | POA: Diagnosis not present

## 2020-06-02 DIAGNOSIS — R809 Proteinuria, unspecified: Secondary | ICD-10-CM | POA: Diagnosis not present

## 2020-06-02 DIAGNOSIS — M810 Age-related osteoporosis without current pathological fracture: Secondary | ICD-10-CM | POA: Diagnosis not present

## 2020-06-02 DIAGNOSIS — I10 Essential (primary) hypertension: Secondary | ICD-10-CM | POA: Diagnosis not present

## 2020-06-02 DIAGNOSIS — K219 Gastro-esophageal reflux disease without esophagitis: Secondary | ICD-10-CM | POA: Diagnosis not present

## 2020-06-02 DIAGNOSIS — L821 Other seborrheic keratosis: Secondary | ICD-10-CM | POA: Diagnosis not present

## 2020-06-02 DIAGNOSIS — Z136 Encounter for screening for cardiovascular disorders: Secondary | ICD-10-CM | POA: Diagnosis not present

## 2020-06-02 DIAGNOSIS — Z0000 Encounter for general adult medical examination without abnormal findings: Secondary | ICD-10-CM | POA: Diagnosis not present

## 2020-07-27 ENCOUNTER — Other Ambulatory Visit (HOSPITAL_COMMUNITY): Payer: Self-pay | Admitting: Family Medicine

## 2020-07-27 DIAGNOSIS — H35371 Puckering of macula, right eye: Secondary | ICD-10-CM | POA: Diagnosis not present

## 2020-07-27 DIAGNOSIS — H35361 Drusen (degenerative) of macula, right eye: Secondary | ICD-10-CM | POA: Diagnosis not present

## 2020-07-27 DIAGNOSIS — H35033 Hypertensive retinopathy, bilateral: Secondary | ICD-10-CM | POA: Diagnosis not present

## 2020-07-27 DIAGNOSIS — H40013 Open angle with borderline findings, low risk, bilateral: Secondary | ICD-10-CM | POA: Diagnosis not present

## 2020-08-31 ENCOUNTER — Ambulatory Visit: Payer: Medicare Other | Attending: Internal Medicine

## 2020-08-31 DIAGNOSIS — Z23 Encounter for immunization: Secondary | ICD-10-CM

## 2020-08-31 NOTE — Progress Notes (Signed)
   Covid-19 Vaccination Clinic  Name:  MOSELLE RISTER    MRN: [id number] DOB: December 07, 1945  08/31/2020  Ms. Newby was observed post Covid-19 immunization for 15 minutes without incident. She was provided with Vaccine Information Sheet and instruction to access the V-Safe system.   Ms. Huyser was instructed to call 911 with any severe reactions post vaccine: Marland Kitchen Difficulty breathing  . Swelling of face and throat  . A fast heartbeat  . A bad rash all over body  . Dizziness and weakness

## 2020-11-16 ENCOUNTER — Other Ambulatory Visit: Payer: Medicare Other

## 2020-11-16 DIAGNOSIS — Z20822 Contact with and (suspected) exposure to covid-19: Secondary | ICD-10-CM | POA: Diagnosis not present

## 2020-11-17 LAB — SARS-COV-2, NAA 2 DAY TAT

## 2020-11-17 LAB — NOVEL CORONAVIRUS, NAA: SARS-CoV-2, NAA: NOT DETECTED

## 2020-12-01 DIAGNOSIS — M810 Age-related osteoporosis without current pathological fracture: Secondary | ICD-10-CM | POA: Diagnosis not present

## 2020-12-01 DIAGNOSIS — Z23 Encounter for immunization: Secondary | ICD-10-CM | POA: Diagnosis not present

## 2020-12-01 DIAGNOSIS — I10 Essential (primary) hypertension: Secondary | ICD-10-CM | POA: Diagnosis not present

## 2020-12-01 DIAGNOSIS — R252 Cramp and spasm: Secondary | ICD-10-CM | POA: Diagnosis not present

## 2020-12-01 DIAGNOSIS — R809 Proteinuria, unspecified: Secondary | ICD-10-CM | POA: Diagnosis not present

## 2020-12-01 DIAGNOSIS — K219 Gastro-esophageal reflux disease without esophagitis: Secondary | ICD-10-CM | POA: Diagnosis not present

## 2021-03-07 DIAGNOSIS — Z1231 Encounter for screening mammogram for malignant neoplasm of breast: Secondary | ICD-10-CM | POA: Diagnosis not present

## 2021-03-07 DIAGNOSIS — Z6821 Body mass index (BMI) 21.0-21.9, adult: Secondary | ICD-10-CM | POA: Diagnosis not present

## 2021-03-07 DIAGNOSIS — Z01419 Encounter for gynecological examination (general) (routine) without abnormal findings: Secondary | ICD-10-CM | POA: Diagnosis not present

## 2021-03-25 DIAGNOSIS — H35033 Hypertensive retinopathy, bilateral: Secondary | ICD-10-CM | POA: Diagnosis not present

## 2021-03-25 DIAGNOSIS — H40013 Open angle with borderline findings, low risk, bilateral: Secondary | ICD-10-CM | POA: Diagnosis not present

## 2021-03-25 DIAGNOSIS — H35371 Puckering of macula, right eye: Secondary | ICD-10-CM | POA: Diagnosis not present

## 2021-03-25 DIAGNOSIS — H35363 Drusen (degenerative) of macula, bilateral: Secondary | ICD-10-CM | POA: Diagnosis not present

## 2021-03-29 DIAGNOSIS — L648 Other androgenic alopecia: Secondary | ICD-10-CM | POA: Diagnosis not present

## 2021-03-29 DIAGNOSIS — L821 Other seborrheic keratosis: Secondary | ICD-10-CM | POA: Diagnosis not present

## 2021-04-14 ENCOUNTER — Other Ambulatory Visit (HOSPITAL_COMMUNITY): Payer: Self-pay

## 2021-04-14 MED FILL — Bisoprolol & Hydrochlorothiazide Tab 2.5-6.25 MG: ORAL | 30 days supply | Qty: 30 | Fill #0 | Status: AC

## 2021-04-15 ENCOUNTER — Other Ambulatory Visit (HOSPITAL_COMMUNITY): Payer: Self-pay

## 2021-05-05 DIAGNOSIS — D485 Neoplasm of uncertain behavior of skin: Secondary | ICD-10-CM | POA: Diagnosis not present

## 2021-05-05 DIAGNOSIS — L820 Inflamed seborrheic keratosis: Secondary | ICD-10-CM | POA: Diagnosis not present

## 2021-05-17 ENCOUNTER — Other Ambulatory Visit (HOSPITAL_COMMUNITY): Payer: Self-pay

## 2021-05-17 MED ORDER — BISOPROLOL-HYDROCHLOROTHIAZIDE 2.5-6.25 MG PO TABS
ORAL_TABLET | ORAL | 12 refills | Status: DC
  Filled 2021-05-17: qty 30, 30d supply, fill #0
  Filled 2021-06-27: qty 30, 30d supply, fill #1
  Filled 2021-06-27: qty 30, 30d supply, fill #0
  Filled 2021-07-25: qty 30, 30d supply, fill #1

## 2021-05-17 MED FILL — Bisoprolol & Hydrochlorothiazide Tab 2.5-6.25 MG: ORAL | 30 days supply | Qty: 30 | Fill #1 | Status: CN

## 2021-06-27 ENCOUNTER — Other Ambulatory Visit (HOSPITAL_COMMUNITY): Payer: Self-pay

## 2021-07-25 ENCOUNTER — Other Ambulatory Visit (HOSPITAL_COMMUNITY): Payer: Self-pay

## 2021-07-26 ENCOUNTER — Ambulatory Visit: Payer: Medicare Other

## 2021-07-26 ENCOUNTER — Encounter: Payer: Self-pay | Admitting: Podiatrist

## 2021-07-26 ENCOUNTER — Ambulatory Visit (INDEPENDENT_AMBULATORY_CARE_PROVIDER_SITE_OTHER): Payer: Medicare Other | Admitting: Podiatrist

## 2021-07-26 ENCOUNTER — Other Ambulatory Visit: Payer: Self-pay

## 2021-07-26 DIAGNOSIS — S9001XA Contusion of right ankle, initial encounter: Secondary | ICD-10-CM

## 2021-07-26 DIAGNOSIS — S9002XA Contusion of left ankle, initial encounter: Secondary | ICD-10-CM

## 2021-07-26 NOTE — Progress Notes (Signed)
Chief Complaint  Patient presents with   Foot Swelling    Right ankle- random swelling on Sunday morning. It has decreased now but he is still concern with the redness.      HPI: Patient is 75 y.o. female who presents today for swelling on the medial aspect of the right ankle.  She relates no trauma or injury she can recall. She did wear some shoes Saturday night that may have contributed. She doesn't recall a bee sting or bug bite.  She relates the redness has subsided but is still present and she is concerned.    Allergies  Allergen Reactions   Lisinopril Other (See Comments)    fatigue    Review of systems is reviewed and negative.   Physical Exam  Patient is awake, alert, and oriented x 3.  In no acute distress.    Vascular status is intact with palpable pedal pulses DP and PT bilateral and capillary refill time less than 3 seconds bilateral.  No edema or erythema noted.   Neurological exam reveals epicritic and protective sensation grossly intact bilateral.    Musculoskeletal exam: Musculature intact with dorsiflexion, plantarflexion, inversion, eversion. Ankle and First MPJ joint range of motion normal.   Dermatological exam reveals skin is supple and dry to bilateral feet.  Redness and swelling is present over the navicular on the right foot.  No streaking or lymphangitis is present no fluctuance within the area of redness is noted.  Mild discomfort with palpation is noted.  Assessment: 1. Contusion of right ankle, initial encounter      Plan: Discussed exam findings with the patient.  Discussed that appeared to be either a irritation from shoes or possibly a irritation from a bug bite.  Discussed that because it is already starting to get better on its own it would probably self resolve.  Recommended compression and ice as well as rest.  Discussed if the redness gets worse or that she notices any swelling or pain she is instructed to call immediately otherwise that should  be self resolving.

## 2021-07-26 NOTE — Patient Instructions (Signed)
Foot Contusion A foot contusion is a deep bruise to the foot. Deep bruises happen when an injury causes bleeding under the skin. The skin over the bruise may turn blue, purple, or yellow. Minor injuries will cause a bruise that is painless. Deepbruises that are worse may stay painful and swollen for a few weeks. What are the causes? This condition is most often caused by a hard hit or direct force to your foot,such as having a heavy object fall on your foot. What are the signs or symptoms?  Swelling of the foot. Pain and tenderness of the foot. Changes in the color of the foot. The area may have redness and then turn blue, purple, or yellow. How is this treated? In general, the best treatment for a foot contusion is rest, ice, pressure (compression), and elevation. This is often called RICE therapy. An elastic wrap may berecommended to support your foot.  Your doctor may also suggest over-the-counter medicines for pain control. Ifyour swelling or pain is very bad, you may be given crutches. Follow these instructions at home: RICE therapy  Rest the injured area. Try to avoid standing or walking while your foot hurts. If told, put ice on the injured area: Put ice in a plastic bag. Place a towel between your skin and the bag. Leave the ice on for 20 minutes, 2-3 times a day. If told, put light pressure on the injured area using an elastic wrap. Make sure the wrap is not too tight. If your toes turn numb, cold, or blue, take the wrap off and put it back on more loosely. Remove and put the wrap back on as told by your doctor. Raise (elevate) the injured area above the level of your heart while you are sitting or lying down.  General instructions Take over-the-counter and prescription medicines only as told by your doctor. Use crutches as told by your doctor, if this applies. Do not use any products that contain nicotine or tobacco, such as cigarettes, e-cigarettes, and chewing tobacco. These  can delay healing. If you need help quitting, ask your doctor. Keep all follow-up visits as told by your doctor. This is important. Contact a doctor if: Your symptoms do not get better after many days of treatment. You have redness, swelling, or pain in your foot or toes. You have trouble moving the injured area. Medicine does not help your swelling or pain. Get help right away if: You have very bad pain. Your foot or toes are numb. Your foot or toes turn very light (pale) or cold. You cannot move your foot or ankle. Your foot feels warm when you touch it. Summary A foot contusion is a deep bruise to the foot. This condition is most often caused by a hard hit or direct force to your foot. Symptoms include swelling, pain, and color changes in the injured area. In general, the best treatment for a foot contusion is rest, ice, pressure (compression), and elevation. This information is not intended to replace advice given to you by your health care provider. Make sure you discuss any questions you have with your healthcare provider. Document Revised: 07/17/2018 Document Reviewed: 07/17/2018 Elsevier Patient Education  2022 [employer].

## 2021-08-17 DIAGNOSIS — M542 Cervicalgia: Secondary | ICD-10-CM | POA: Diagnosis not present

## 2021-08-17 DIAGNOSIS — R809 Proteinuria, unspecified: Secondary | ICD-10-CM | POA: Diagnosis not present

## 2021-08-17 DIAGNOSIS — K219 Gastro-esophageal reflux disease without esophagitis: Secondary | ICD-10-CM | POA: Diagnosis not present

## 2021-08-17 DIAGNOSIS — R3915 Urgency of urination: Secondary | ICD-10-CM | POA: Diagnosis not present

## 2021-08-17 DIAGNOSIS — I10 Essential (primary) hypertension: Secondary | ICD-10-CM | POA: Diagnosis not present

## 2021-08-17 DIAGNOSIS — M810 Age-related osteoporosis without current pathological fracture: Secondary | ICD-10-CM | POA: Diagnosis not present

## 2021-08-17 DIAGNOSIS — Z0000 Encounter for general adult medical examination without abnormal findings: Secondary | ICD-10-CM | POA: Diagnosis not present

## 2021-08-29 ENCOUNTER — Other Ambulatory Visit (HOSPITAL_COMMUNITY): Payer: Self-pay

## 2021-08-29 MED ORDER — BISOPROLOL-HYDROCHLOROTHIAZIDE 2.5-6.25 MG PO TABS
1.0000 | ORAL_TABLET | Freq: Every day | ORAL | 3 refills | Status: DC
  Filled 2021-08-29: qty 90, 90d supply, fill #0
  Filled 2021-11-21: qty 90, 90d supply, fill #1
  Filled 2022-02-22: qty 90, 90d supply, fill #2
  Filled 2022-05-22: qty 20, 20d supply, fill #3
  Filled 2022-05-22: qty 70, 70d supply, fill #3

## 2021-11-08 DIAGNOSIS — R634 Abnormal weight loss: Secondary | ICD-10-CM | POA: Diagnosis not present

## 2021-11-08 DIAGNOSIS — M79675 Pain in left toe(s): Secondary | ICD-10-CM | POA: Diagnosis not present

## 2021-11-08 DIAGNOSIS — M542 Cervicalgia: Secondary | ICD-10-CM | POA: Diagnosis not present

## 2021-11-08 DIAGNOSIS — Z23 Encounter for immunization: Secondary | ICD-10-CM | POA: Diagnosis not present

## 2021-11-22 ENCOUNTER — Other Ambulatory Visit (HOSPITAL_COMMUNITY): Payer: Self-pay

## 2021-11-22 ENCOUNTER — Ambulatory Visit
Admission: RE | Admit: 2021-11-22 | Discharge: 2021-11-22 | Disposition: A | Discharge status: Home / Self Care | Payer: Medicare Other | Source: Ambulatory Visit | Attending: Family Medicine | Admitting: Family Medicine

## 2021-11-22 ENCOUNTER — Other Ambulatory Visit: Payer: Self-pay | Admitting: Family Medicine

## 2021-11-22 DIAGNOSIS — M47812 Spondylosis without myelopathy or radiculopathy, cervical region: Secondary | ICD-10-CM | POA: Diagnosis not present

## 2021-11-22 DIAGNOSIS — M542 Cervicalgia: Secondary | ICD-10-CM

## 2021-11-22 MED ORDER — MELOXICAM 15 MG PO TABS
15.0000 mg | ORAL_TABLET | Freq: Every day | ORAL | 12 refills | Status: DC | PRN
  Filled 2021-11-22: qty 30, 30d supply, fill #0

## 2021-11-28 ENCOUNTER — Other Ambulatory Visit (HOSPITAL_COMMUNITY): Payer: Self-pay

## 2021-11-28 DIAGNOSIS — U071 COVID-19: Secondary | ICD-10-CM | POA: Diagnosis not present

## 2021-11-28 MED ORDER — PAXLOVID (300/100) 20 X 150 MG & 10 X 100MG PO TBPK
ORAL_TABLET | Freq: Two times a day (BID) | ORAL | 0 refills | Status: DC
  Filled 2021-11-28 (×2): qty 30, 5d supply, fill #0

## 2021-12-05 DIAGNOSIS — M5412 Radiculopathy, cervical region: Secondary | ICD-10-CM | POA: Diagnosis not present

## 2021-12-05 DIAGNOSIS — M4692 Unspecified inflammatory spondylopathy, cervical region: Secondary | ICD-10-CM | POA: Diagnosis not present

## 2021-12-08 DIAGNOSIS — M542 Cervicalgia: Secondary | ICD-10-CM | POA: Diagnosis not present

## 2021-12-15 DIAGNOSIS — M542 Cervicalgia: Secondary | ICD-10-CM | POA: Diagnosis not present

## 2022-02-14 DIAGNOSIS — E7800 Pure hypercholesterolemia, unspecified: Secondary | ICD-10-CM | POA: Diagnosis not present

## 2022-02-14 DIAGNOSIS — K219 Gastro-esophageal reflux disease without esophagitis: Secondary | ICD-10-CM | POA: Diagnosis not present

## 2022-02-14 DIAGNOSIS — R809 Proteinuria, unspecified: Secondary | ICD-10-CM | POA: Diagnosis not present

## 2022-02-14 DIAGNOSIS — M542 Cervicalgia: Secondary | ICD-10-CM | POA: Diagnosis not present

## 2022-02-14 DIAGNOSIS — R252 Cramp and spasm: Secondary | ICD-10-CM | POA: Diagnosis not present

## 2022-02-14 DIAGNOSIS — M810 Age-related osteoporosis without current pathological fracture: Secondary | ICD-10-CM | POA: Diagnosis not present

## 2022-02-14 DIAGNOSIS — I10 Essential (primary) hypertension: Secondary | ICD-10-CM | POA: Diagnosis not present

## 2022-02-22 ENCOUNTER — Other Ambulatory Visit (HOSPITAL_COMMUNITY): Payer: Self-pay

## 2022-02-23 ENCOUNTER — Other Ambulatory Visit (HOSPITAL_COMMUNITY): Payer: Self-pay

## 2022-03-24 DIAGNOSIS — L819 Disorder of pigmentation, unspecified: Secondary | ICD-10-CM | POA: Diagnosis not present

## 2022-03-24 DIAGNOSIS — I10 Essential (primary) hypertension: Secondary | ICD-10-CM | POA: Diagnosis not present

## 2022-03-30 DIAGNOSIS — I10 Essential (primary) hypertension: Secondary | ICD-10-CM | POA: Diagnosis not present

## 2022-03-30 DIAGNOSIS — L819 Disorder of pigmentation, unspecified: Secondary | ICD-10-CM | POA: Diagnosis not present

## 2022-03-30 DIAGNOSIS — I999 Unspecified disorder of circulatory system: Secondary | ICD-10-CM | POA: Diagnosis not present

## 2022-04-28 ENCOUNTER — Other Ambulatory Visit: Payer: Self-pay | Admitting: Family Medicine

## 2022-04-28 DIAGNOSIS — Z1231 Encounter for screening mammogram for malignant neoplasm of breast: Secondary | ICD-10-CM

## 2022-05-19 ENCOUNTER — Ambulatory Visit
Admission: RE | Admit: 2022-05-19 | Discharge: 2022-05-19 | Disposition: A | Discharge status: Home / Self Care | Payer: Medicare Other | Source: Ambulatory Visit | Attending: Family Medicine | Admitting: Family Medicine

## 2022-05-19 DIAGNOSIS — Z1231 Encounter for screening mammogram for malignant neoplasm of breast: Secondary | ICD-10-CM | POA: Diagnosis not present

## 2022-05-22 ENCOUNTER — Other Ambulatory Visit (HOSPITAL_COMMUNITY): Payer: Self-pay

## 2022-05-23 ENCOUNTER — Other Ambulatory Visit: Payer: Self-pay | Admitting: Family Medicine

## 2022-05-23 DIAGNOSIS — R928 Other abnormal and inconclusive findings on diagnostic imaging of breast: Secondary | ICD-10-CM

## 2022-05-31 ENCOUNTER — Ambulatory Visit: Admission: RE | Admit: 2022-05-31 | Payer: Medicare Other | Source: Ambulatory Visit

## 2022-05-31 ENCOUNTER — Ambulatory Visit
Admission: RE | Admit: 2022-05-31 | Discharge: 2022-05-31 | Disposition: A | Discharge status: Home / Self Care | Payer: Medicare Other | Source: Ambulatory Visit | Attending: Family Medicine | Admitting: Family Medicine

## 2022-05-31 DIAGNOSIS — R928 Other abnormal and inconclusive findings on diagnostic imaging of breast: Secondary | ICD-10-CM

## 2022-07-17 DIAGNOSIS — H04123 Dry eye syndrome of bilateral lacrimal glands: Secondary | ICD-10-CM | POA: Diagnosis not present

## 2022-07-17 DIAGNOSIS — H40013 Open angle with borderline findings, low risk, bilateral: Secondary | ICD-10-CM | POA: Diagnosis not present

## 2022-08-16 ENCOUNTER — Other Ambulatory Visit (HOSPITAL_COMMUNITY): Payer: Self-pay

## 2022-08-16 MED ORDER — BISOPROLOL-HYDROCHLOROTHIAZIDE 2.5-6.25 MG PO TABS
1.0000 | ORAL_TABLET | Freq: Every day | ORAL | 0 refills | Status: DC
  Filled 2022-08-16: qty 80, 80d supply, fill #0
  Filled 2022-08-16: qty 10, 10d supply, fill #0

## 2022-08-30 ENCOUNTER — Other Ambulatory Visit: Payer: Self-pay | Admitting: Family Medicine

## 2022-08-30 DIAGNOSIS — K219 Gastro-esophageal reflux disease without esophagitis: Secondary | ICD-10-CM | POA: Diagnosis not present

## 2022-08-30 DIAGNOSIS — R809 Proteinuria, unspecified: Secondary | ICD-10-CM | POA: Diagnosis not present

## 2022-08-30 DIAGNOSIS — M8588 Other specified disorders of bone density and structure, other site: Secondary | ICD-10-CM | POA: Diagnosis not present

## 2022-08-30 DIAGNOSIS — M8580 Other specified disorders of bone density and structure, unspecified site: Secondary | ICD-10-CM

## 2022-08-30 DIAGNOSIS — E2839 Other primary ovarian failure: Secondary | ICD-10-CM

## 2022-08-30 DIAGNOSIS — E7800 Pure hypercholesterolemia, unspecified: Secondary | ICD-10-CM | POA: Diagnosis not present

## 2022-08-30 DIAGNOSIS — H35033 Hypertensive retinopathy, bilateral: Secondary | ICD-10-CM | POA: Diagnosis not present

## 2022-08-30 DIAGNOSIS — I10 Essential (primary) hypertension: Secondary | ICD-10-CM | POA: Diagnosis not present

## 2022-08-30 DIAGNOSIS — Z23 Encounter for immunization: Secondary | ICD-10-CM | POA: Diagnosis not present

## 2022-08-30 DIAGNOSIS — N3281 Overactive bladder: Secondary | ICD-10-CM | POA: Diagnosis not present

## 2022-08-30 DIAGNOSIS — Z0000 Encounter for general adult medical examination without abnormal findings: Secondary | ICD-10-CM | POA: Diagnosis not present

## 2022-09-06 ENCOUNTER — Ambulatory Visit
Admission: RE | Admit: 2022-09-06 | Discharge: 2022-09-06 | Disposition: A | Discharge status: Home / Self Care | Payer: Medicare Other | Source: Ambulatory Visit | Attending: Family Medicine | Admitting: Family Medicine

## 2022-09-06 DIAGNOSIS — K224 Dyskinesia of esophagus: Secondary | ICD-10-CM | POA: Diagnosis not present

## 2022-09-06 DIAGNOSIS — K219 Gastro-esophageal reflux disease without esophagitis: Secondary | ICD-10-CM

## 2022-11-23 ENCOUNTER — Other Ambulatory Visit (HOSPITAL_COMMUNITY): Payer: Self-pay

## 2022-11-23 MED ORDER — BISOPROLOL-HYDROCHLOROTHIAZIDE 2.5-6.25 MG PO TABS
1.0000 | ORAL_TABLET | Freq: Every day | ORAL | 1 refills | Status: DC
Start: 2022-11-23 — End: 2023-05-14
  Filled 2022-11-23: qty 90, 90d supply, fill #0
  Filled 2023-02-14: qty 90, 90d supply, fill #1

## 2022-11-24 ENCOUNTER — Other Ambulatory Visit (HOSPITAL_COMMUNITY): Payer: Self-pay

## 2023-02-02 ENCOUNTER — Other Ambulatory Visit (HOSPITAL_COMMUNITY): Payer: Self-pay

## 2023-02-02 DIAGNOSIS — N3281 Overactive bladder: Secondary | ICD-10-CM | POA: Diagnosis not present

## 2023-02-02 DIAGNOSIS — M8588 Other specified disorders of bone density and structure, other site: Secondary | ICD-10-CM | POA: Diagnosis not present

## 2023-02-02 DIAGNOSIS — M47812 Spondylosis without myelopathy or radiculopathy, cervical region: Secondary | ICD-10-CM | POA: Diagnosis not present

## 2023-02-02 DIAGNOSIS — K219 Gastro-esophageal reflux disease without esophagitis: Secondary | ICD-10-CM | POA: Diagnosis not present

## 2023-02-02 DIAGNOSIS — H35033 Hypertensive retinopathy, bilateral: Secondary | ICD-10-CM | POA: Diagnosis not present

## 2023-02-02 DIAGNOSIS — I10 Essential (primary) hypertension: Secondary | ICD-10-CM | POA: Diagnosis not present

## 2023-02-02 DIAGNOSIS — R809 Proteinuria, unspecified: Secondary | ICD-10-CM | POA: Diagnosis not present

## 2023-02-02 MED ORDER — OMEPRAZOLE 20 MG PO CPDR
20.0000 mg | DELAYED_RELEASE_CAPSULE | Freq: Every morning | ORAL | 12 refills | Status: DC
  Filled 2023-02-02 (×4): qty 30, 30d supply, fill #0

## 2023-02-26 ENCOUNTER — Other Ambulatory Visit (HOSPITAL_COMMUNITY): Payer: Self-pay

## 2023-02-26 DIAGNOSIS — L309 Dermatitis, unspecified: Secondary | ICD-10-CM | POA: Diagnosis not present

## 2023-02-26 MED ORDER — TRIAMCINOLONE ACETONIDE 0.1 % EX CREA
1.0000 | TOPICAL_CREAM | Freq: Three times a day (TID) | CUTANEOUS | 1 refills | Status: DC | PRN
  Filled 2023-02-26: qty 30, 10d supply, fill #0

## 2023-02-26 MED ORDER — PREDNISONE 20 MG PO TABS
ORAL_TABLET | ORAL | 0 refills | Status: AC
  Filled 2023-02-26: qty 12, 6d supply, fill #0

## 2023-03-14 ENCOUNTER — Ambulatory Visit
Admission: EM | Admit: 2023-03-14 | Discharge: 2023-03-14 | Disposition: A | Discharge status: Home / Self Care | Payer: Medicare Other | Attending: Family Medicine | Admitting: Family Medicine

## 2023-03-14 DIAGNOSIS — H8110 Benign paroxysmal vertigo, unspecified ear: Secondary | ICD-10-CM | POA: Diagnosis not present

## 2023-03-14 MED ORDER — ONDANSETRON 4 MG PO TBDP: 4.0000 mg | ORAL_TABLET | Freq: Three times a day (TID) | ORAL | 0 refills | Status: DC | PRN

## 2023-03-14 MED ORDER — ONDANSETRON 4 MG PO TBDP
4.0000 mg | ORAL_TABLET | Freq: Three times a day (TID) | ORAL | 0 refills | Status: DC | PRN
  Filled 2023-03-14: qty 15, 5d supply, fill #0

## 2023-03-14 MED ORDER — MECLIZINE HCL 25 MG PO TABS
25.0000 mg | ORAL_TABLET | Freq: Three times a day (TID) | ORAL | 0 refills | Status: DC | PRN
  Filled 2023-03-14: qty 30, 10d supply, fill #0

## 2023-03-14 MED ORDER — MECLIZINE HCL 25 MG PO TABS: 25.0000 mg | ORAL_TABLET | Freq: Three times a day (TID) | ORAL | 0 refills | Status: DC | PRN

## 2023-03-14 NOTE — ED Triage Notes (Signed)
Pt c/o ear ringing for several weeks. States today she has felt nauseous and dizzy.

## 2023-03-15 ENCOUNTER — Other Ambulatory Visit (HOSPITAL_COMMUNITY): Payer: Self-pay

## 2023-03-17 NOTE — ED Provider Notes (Signed)
[facility]   [id number] 03/14/23 Arrival Time: 1741  ASSESSMENT & PLAN:  1. Benign paroxysmal positional vertigo, unspecified laterality     Normal neurologic exam. No suspicion for ICH or SAH. No indication for neurodiagnostic imaging at this time. Discussed.  ECG: NSR. No acute changes when compared to previous ECG.  Meds ordered this encounter  Medications   meclizine (ANTIVERT) 25 MG tablet    Sig: Take 1 tablet (25 mg total) by mouth 3 (three) times daily as needed for dizziness.    Dispense:  30 tablet    Refill:  0   ondansetron (ZOFRAN-ODT) 4 MG disintegrating tablet    Sig: Take 1 tablet (4 mg total) by mouth every 8 (eight) hours as needed for nausea or vomiting.    Dispense:  15 tablet    Refill:  0   Reassured that these symptoms do not appear to represent a serious or threatening condition. This is generally a self-limited temporary but uncomfortable situation. Rest, avoid potentially dangerous activities (such as driving or working with machinery or at heights). Use  Meclizine prn. Agrees to proceed to the ED if she develops other symptoms such as alterations of speech, swallowing, vision, motor/sensory systems, or if dizziness worsens.  Reviewed expectations re: course of current medical issues. Questions answered. Outlined signs and symptoms indicating need for more acute intervention. Patient verbalized understanding. After Visit Summary given.   SUBJECTIVE:  Jenny Mitchell is a 77 y.o. female who reports gradual onset of dizziness described as vertigo. Current symptoms first noted today and have progressed to a point and plateaued. Does report a ringing in here R ear for a few weeks; sporadic; no hearing loss. Experiencing vertigo infrequently with certain movements and with episodes typically lasting until she can remain still. Aggravating factors: head movements, bending, and rolling over in bed. Denies coordination problems, gait problems,  headaches, paresthesia, speech problems, tremors, and weakness as well as otalgia. Recent infections: none. Head trauma: denied. Noise exposure:  denied . No associated SOB, CP, or palpatations reported. Recent travel: none. Reports normal bowel/bladder habits. Previous workup/treatments: none. H/O similar: no. Therapies tried thus far: none.  Social History   Substance and Sexual Activity  Alcohol Use No   Social History   Tobacco Use  Smoking Status Never  Smokeless Tobacco Never   Denies recreational drug use.    OBJECTIVE:  Vitals:   03/14/23 1940  BP: (!) 144/78  Pulse: 66  Resp: 16  Temp: 98.2 F (36.8 C)  TempSrc: Oral  SpO2: 98%    General appearance: alert; no distress Eyes: PERRLA; EOMI; conjunctiva normal HENT: normocephalic; atraumatic; TMs normal; nasal mucosa normal; oral mucosa normal Neck: supple with FROM Lungs: clear to auscultation bilaterally Heart: regular Abdomen: soft, non-tender; bowel sounds normal Extremities: no cyanosis or edema; symmetrical with no gross deformities Skin: warm and dry Neurologic: normal gait; DTR's normal and symmetric; CN 2-12 grossly intact; rapid changes in position during the exam do precipitate brief dizziness without appreciable nystagmus. Psychological: alert and cooperative; normal mood and affect  Investigations:  Labs Reviewed - No data to display No results found.  Allergies  Allergen Reactions   Lisinopril Other (See Comments)    fatigue    Past Medical History:  Diagnosis Date   Hypertension    Social History   Socioeconomic History   Marital status: Married    Spouse name: Not on file   Number of children: Not on file   Years of education:  Not on file   Highest education level: Not on file  Occupational History   Not on file  Tobacco Use   Smoking status: Never   Smokeless tobacco: Never  Substance and Sexual Activity   Alcohol use: No   Drug use: No   Sexual activity: Not on file   Other Topics Concern   Not on file  Social History Narrative   Not on file   Social Determinants of Health   Financial Resource Strain: Not on file  Food Insecurity: Not on file  Transportation Needs: Not on file  Physical Activity: Not on file  Stress: Not on file  Social Connections: Not on file  Intimate Partner Violence: Not on file   History reviewed. No pertinent family history. History reviewed. No pertinent surgical history.     Mardella Layman, MD 03/17/23 [phone]

## 2023-03-21 DIAGNOSIS — H9319 Tinnitus, unspecified ear: Secondary | ICD-10-CM | POA: Diagnosis not present

## 2023-03-21 DIAGNOSIS — M8580 Other specified disorders of bone density and structure, unspecified site: Secondary | ICD-10-CM | POA: Diagnosis not present

## 2023-03-21 DIAGNOSIS — I10 Essential (primary) hypertension: Secondary | ICD-10-CM | POA: Diagnosis not present

## 2023-03-21 DIAGNOSIS — H8110 Benign paroxysmal vertigo, unspecified ear: Secondary | ICD-10-CM | POA: Diagnosis not present

## 2023-04-17 ENCOUNTER — Other Ambulatory Visit: Payer: Self-pay | Admitting: Family Medicine

## 2023-04-17 DIAGNOSIS — Z1231 Encounter for screening mammogram for malignant neoplasm of breast: Secondary | ICD-10-CM

## 2023-05-14 ENCOUNTER — Other Ambulatory Visit (HOSPITAL_COMMUNITY): Payer: Self-pay

## 2023-05-14 MED ORDER — BISOPROLOL-HYDROCHLOROTHIAZIDE 2.5-6.25 MG PO TABS
1.0000 | ORAL_TABLET | Freq: Every day | ORAL | 1 refills | Status: DC
  Filled 2023-05-14: qty 90, 90d supply, fill #0
  Filled 2023-08-06: qty 90, 90d supply, fill #1

## 2023-05-22 ENCOUNTER — Ambulatory Visit
Admission: RE | Admit: 2023-05-22 | Discharge: 2023-05-22 | Disposition: A | Discharge status: Home / Self Care | Payer: Medicare Other | Source: Ambulatory Visit | Attending: Family Medicine | Admitting: Family Medicine

## 2023-05-22 DIAGNOSIS — Z1231 Encounter for screening mammogram for malignant neoplasm of breast: Secondary | ICD-10-CM | POA: Diagnosis not present

## 2023-07-20 DIAGNOSIS — H40013 Open angle with borderline findings, low risk, bilateral: Secondary | ICD-10-CM | POA: Diagnosis not present

## 2023-07-20 DIAGNOSIS — H35033 Hypertensive retinopathy, bilateral: Secondary | ICD-10-CM | POA: Diagnosis not present

## 2023-07-20 DIAGNOSIS — H04123 Dry eye syndrome of bilateral lacrimal glands: Secondary | ICD-10-CM | POA: Diagnosis not present

## 2023-07-20 DIAGNOSIS — H35363 Drusen (degenerative) of macula, bilateral: Secondary | ICD-10-CM | POA: Diagnosis not present

## 2023-08-06 ENCOUNTER — Other Ambulatory Visit (HOSPITAL_COMMUNITY): Payer: Self-pay

## 2023-08-09 ENCOUNTER — Other Ambulatory Visit (HOSPITAL_COMMUNITY): Payer: Self-pay

## 2023-08-09 ENCOUNTER — Other Ambulatory Visit: Payer: Self-pay | Admitting: Family Medicine

## 2023-08-09 DIAGNOSIS — R2689 Other abnormalities of gait and mobility: Secondary | ICD-10-CM | POA: Diagnosis not present

## 2023-08-09 DIAGNOSIS — R519 Headache, unspecified: Secondary | ICD-10-CM | POA: Diagnosis not present

## 2023-08-09 DIAGNOSIS — R070 Pain in throat: Secondary | ICD-10-CM | POA: Diagnosis not present

## 2023-08-09 DIAGNOSIS — I10 Essential (primary) hypertension: Secondary | ICD-10-CM | POA: Diagnosis not present

## 2023-08-09 MED ORDER — ALPRAZOLAM 0.5 MG PO TABS
0.5000 mg | ORAL_TABLET | ORAL | 0 refills | Status: DC
  Filled 2023-08-09: qty 2, 2d supply, fill #0

## 2023-08-21 ENCOUNTER — Ambulatory Visit
Admission: RE | Admit: 2023-08-21 | Discharge: 2023-08-21 | Disposition: A | Discharge status: Home / Self Care | Payer: Medicare Other | Source: Ambulatory Visit | Attending: Family Medicine | Admitting: Family Medicine

## 2023-08-21 DIAGNOSIS — R2681 Unsteadiness on feet: Secondary | ICD-10-CM | POA: Diagnosis not present

## 2023-08-21 DIAGNOSIS — R519 Headache, unspecified: Secondary | ICD-10-CM | POA: Diagnosis not present

## 2023-08-21 MED ORDER — GADOPICLENOL 0.5 MMOL/ML IV SOLN
6.0000 mL | Freq: Once | INTRAVENOUS | Status: AC | PRN
  Administered 2023-08-21: 6 mL via INTRAVENOUS

## 2023-09-07 ENCOUNTER — Other Ambulatory Visit: Payer: Self-pay | Admitting: Family Medicine

## 2023-09-07 DIAGNOSIS — M8588 Other specified disorders of bone density and structure, other site: Secondary | ICD-10-CM | POA: Diagnosis not present

## 2023-09-07 DIAGNOSIS — M8580 Other specified disorders of bone density and structure, unspecified site: Secondary | ICD-10-CM

## 2023-09-07 DIAGNOSIS — I10 Essential (primary) hypertension: Secondary | ICD-10-CM | POA: Diagnosis not present

## 2023-09-07 DIAGNOSIS — N3281 Overactive bladder: Secondary | ICD-10-CM | POA: Diagnosis not present

## 2023-09-07 DIAGNOSIS — K219 Gastro-esophageal reflux disease without esophagitis: Secondary | ICD-10-CM | POA: Diagnosis not present

## 2023-09-07 DIAGNOSIS — Z0000 Encounter for general adult medical examination without abnormal findings: Secondary | ICD-10-CM | POA: Diagnosis not present

## 2023-09-07 DIAGNOSIS — E7800 Pure hypercholesterolemia, unspecified: Secondary | ICD-10-CM | POA: Diagnosis not present

## 2023-09-07 DIAGNOSIS — Z23 Encounter for immunization: Secondary | ICD-10-CM | POA: Diagnosis not present

## 2023-09-07 DIAGNOSIS — R809 Proteinuria, unspecified: Secondary | ICD-10-CM | POA: Diagnosis not present

## 2023-09-07 DIAGNOSIS — Z1231 Encounter for screening mammogram for malignant neoplasm of breast: Secondary | ICD-10-CM

## 2023-09-07 DIAGNOSIS — H35033 Hypertensive retinopathy, bilateral: Secondary | ICD-10-CM | POA: Diagnosis not present

## 2023-09-07 DIAGNOSIS — R070 Pain in throat: Secondary | ICD-10-CM | POA: Diagnosis not present

## 2023-09-11 DIAGNOSIS — R8290 Unspecified abnormal findings in urine: Secondary | ICD-10-CM | POA: Diagnosis not present

## 2023-09-13 ENCOUNTER — Other Ambulatory Visit (HOSPITAL_COMMUNITY): Payer: Self-pay

## 2023-09-13 DIAGNOSIS — K5730 Diverticulosis of large intestine without perforation or abscess without bleeding: Secondary | ICD-10-CM | POA: Diagnosis not present

## 2023-09-13 DIAGNOSIS — K219 Gastro-esophageal reflux disease without esophagitis: Secondary | ICD-10-CM | POA: Diagnosis not present

## 2023-09-13 DIAGNOSIS — Z860100 Personal history of colon polyps, unspecified: Secondary | ICD-10-CM | POA: Diagnosis not present

## 2023-09-13 DIAGNOSIS — R1310 Dysphagia, unspecified: Secondary | ICD-10-CM | POA: Diagnosis not present

## 2023-09-14 ENCOUNTER — Other Ambulatory Visit (HOSPITAL_COMMUNITY): Payer: Self-pay

## 2023-09-19 ENCOUNTER — Other Ambulatory Visit (HOSPITAL_COMMUNITY): Payer: Self-pay

## 2023-09-19 MED ORDER — FAMOTIDINE 40 MG PO TABS
40.0000 mg | ORAL_TABLET | Freq: Two times a day (BID) | ORAL | 3 refills | Status: AC
  Filled 2023-09-19: qty 180, 90d supply, fill #0
  Filled 2024-04-16: qty 180, 90d supply, fill #2

## 2023-09-27 DIAGNOSIS — M542 Cervicalgia: Secondary | ICD-10-CM | POA: Diagnosis not present

## 2023-10-09 IMAGING — DX DG CERVICAL SPINE COMPLETE 4+V
5 series · 5 of 5 positions shown · non-contrast
Comparison: None.

CLINICAL DATA: 75-year-old female with a history of neck pain

EXAM:
CERVICAL SPINE - COMPLETE 4+ VIEW

[dg cervical spine complete (1 of 5)]
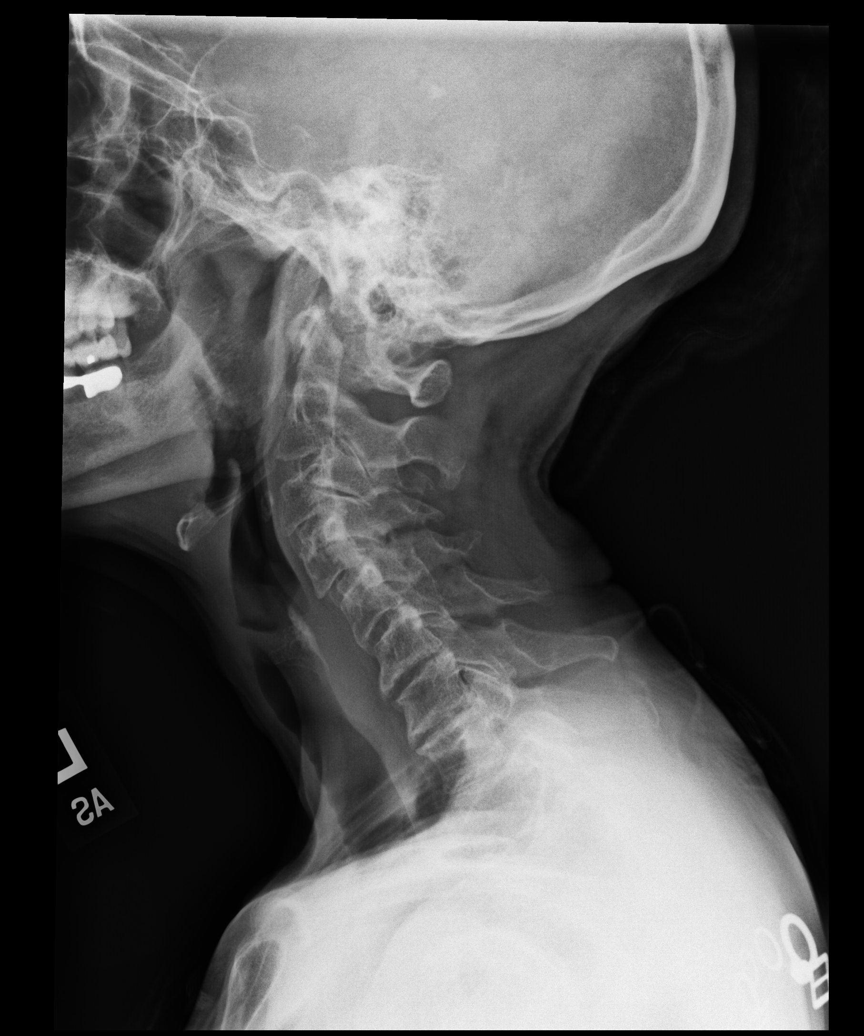

[dg cervical spine complete (2 of 5)]
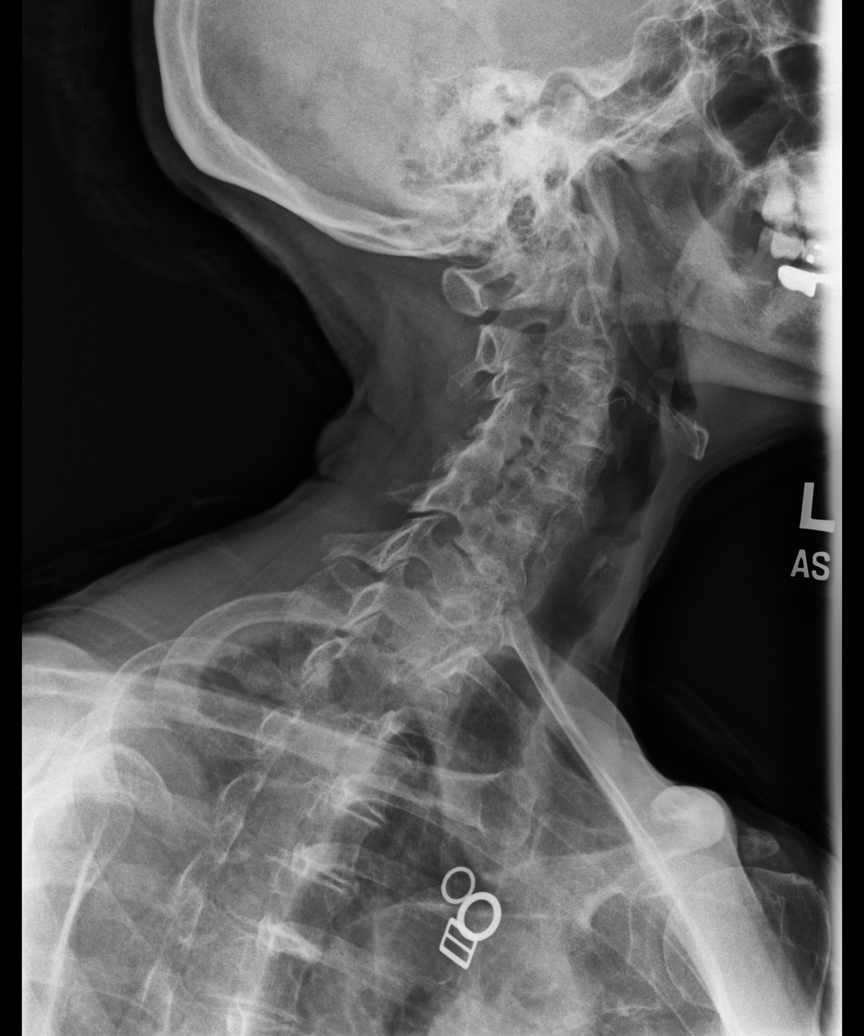

[dg cervical spine complete (3 of 5)]
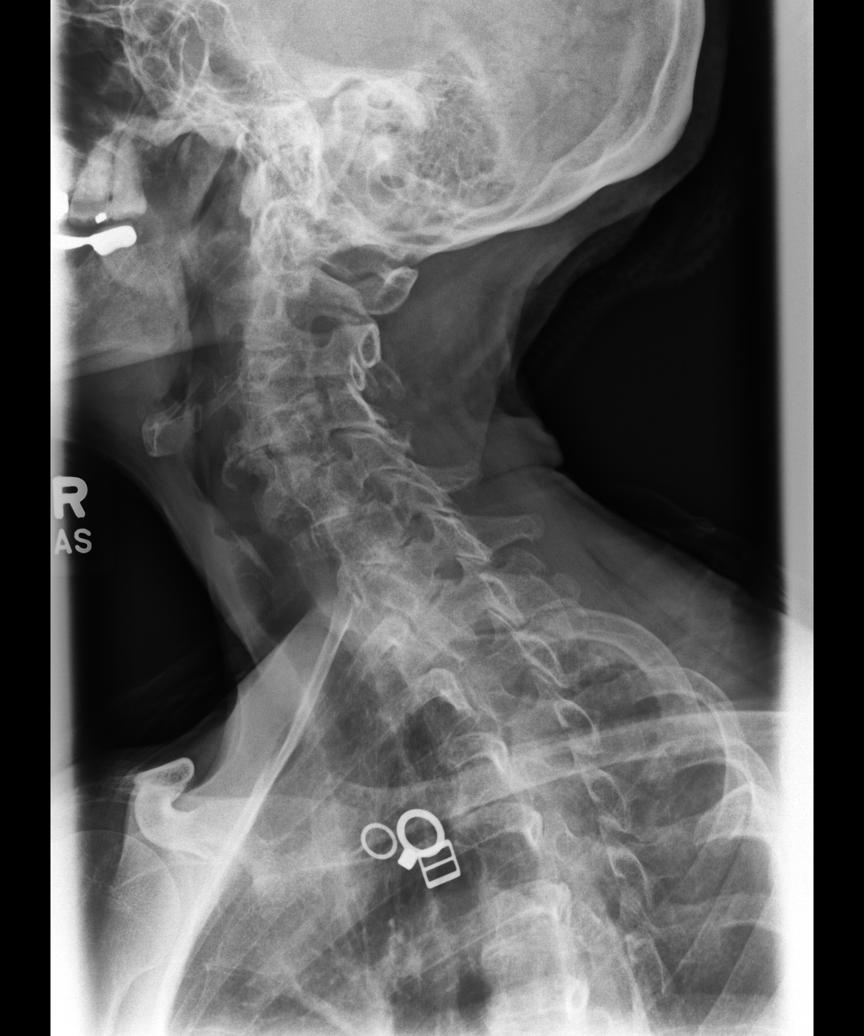

[dg cervical spine complete (4 of 5)]
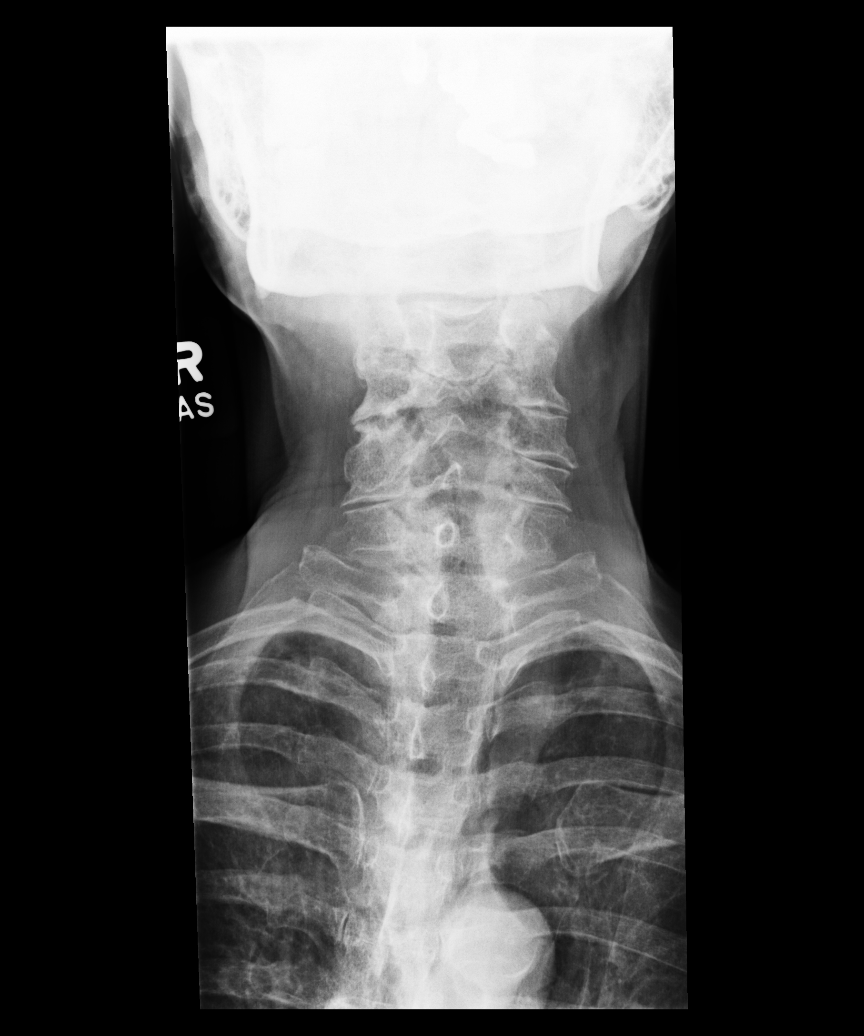

[dg cervical spine complete (5 of 5)]
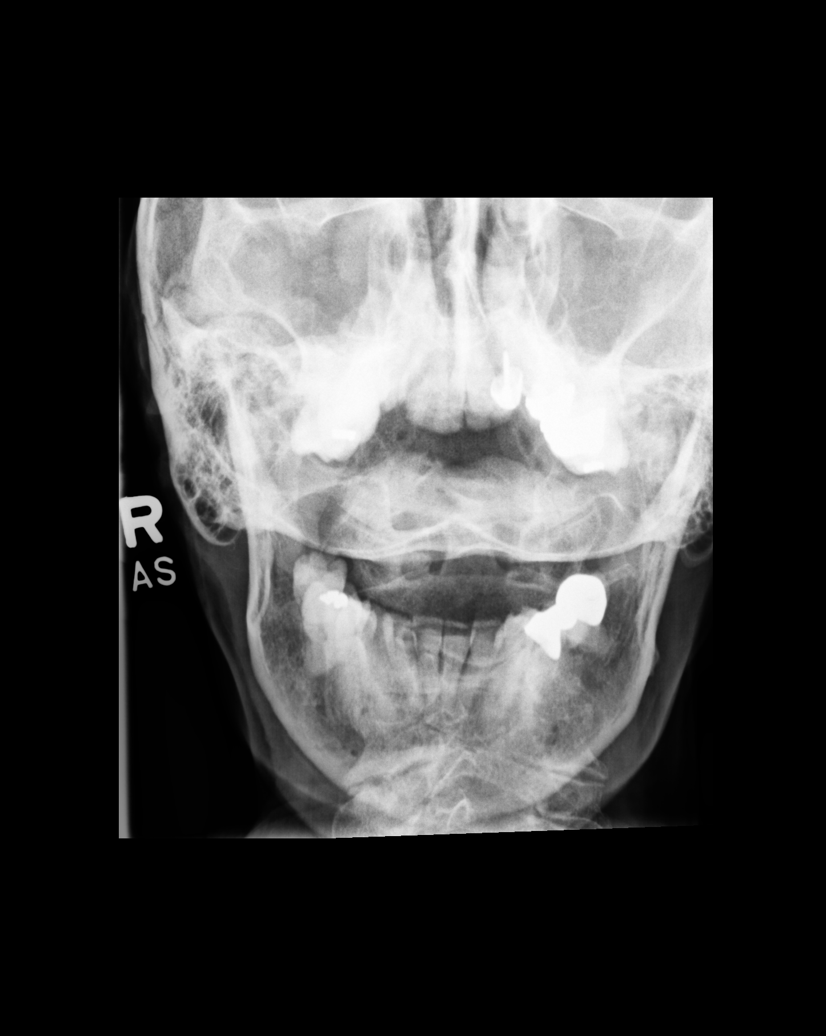

[5 of 5 positions shown; findings below may reference images not displayed]

FINDINGS: Vertebral levels from the level of C1-T1 remain relatively anatomic.
No subluxation.

Craniocervical junction unremarkable.

Disc space narrowing most pronounced at C5-C6, C6-C7, C7-T1 with
associated endplate sclerosis and anterior osteophyte production.

Uncovertebral joint disease present at all levels.

The right oblique images demonstrate foraminal encroachment at
C3-C4, C4-C5, C5-C6 secondary to facet spurring and uncovertebral
joint disease.

Left oblique images demonstrate foraminal encroachment at C3-C4,
C4-C5, C5-C6 secondary to uncovertebral joint disease and facet
spurring.

Facet hypertrophy present bilaterally on the frontal view.

Open mouth odontoid view unremarkable.

No acute displaced fracture identified.
IMPRESSION: Negative for acute bony abnormality.

Disc disease most pronounced at the C5-T1 level.

Foraminal narrowing developing bilaterally spanning C3-C6 as above.

## 2023-10-11 ENCOUNTER — Institutional Professional Consult (permissible substitution) (INDEPENDENT_AMBULATORY_CARE_PROVIDER_SITE_OTHER): Payer: Medicare Other

## 2023-11-06 DIAGNOSIS — H903 Sensorineural hearing loss, bilateral: Secondary | ICD-10-CM | POA: Diagnosis not present

## 2023-11-12 ENCOUNTER — Other Ambulatory Visit (HOSPITAL_COMMUNITY): Payer: Self-pay

## 2023-11-13 ENCOUNTER — Other Ambulatory Visit (HOSPITAL_COMMUNITY): Payer: Self-pay

## 2023-11-13 MED ORDER — BISOPROLOL-HYDROCHLOROTHIAZIDE 2.5-6.25 MG PO TABS
1.0000 | ORAL_TABLET | Freq: Every day | ORAL | 1 refills | Status: AC
  Filled 2023-11-13: qty 90, 90d supply, fill #0
  Filled 2024-02-05: qty 90, 90d supply, fill #1

## 2023-11-26 ENCOUNTER — Ambulatory Visit (INDEPENDENT_AMBULATORY_CARE_PROVIDER_SITE_OTHER): Payer: Medicare Other | Admitting: Neurology

## 2023-11-26 ENCOUNTER — Encounter: Payer: Self-pay | Admitting: Neurology

## 2023-11-26 VITALS — BP 113/63 | Ht 66.0 in | Wt 130.0 lb

## 2023-11-26 DIAGNOSIS — R7309 Other abnormal glucose: Secondary | ICD-10-CM | POA: Diagnosis not present

## 2023-11-26 DIAGNOSIS — I63439 Cerebral infarction due to embolism of unspecified posterior cerebral artery: Secondary | ICD-10-CM | POA: Diagnosis not present

## 2023-11-26 DIAGNOSIS — I639 Cerebral infarction, unspecified: Secondary | ICD-10-CM

## 2023-11-26 DIAGNOSIS — R2689 Other abnormalities of gait and mobility: Secondary | ICD-10-CM | POA: Diagnosis not present

## 2023-11-26 DIAGNOSIS — I4891 Unspecified atrial fibrillation: Secondary | ICD-10-CM | POA: Diagnosis not present

## 2023-11-26 DIAGNOSIS — R42 Dizziness and giddiness: Secondary | ICD-10-CM | POA: Diagnosis not present

## 2023-11-26 MED ORDER — ASPIRIN 81 MG PO TBEC: 81.0000 mg | DELAYED_RELEASE_TABLET | Freq: Every day | ORAL | Status: AC

## 2023-11-26 NOTE — Progress Notes (Unsigned)
[facility]    Provider:  Dr Lucia Gaskins Requesting Provider: Clovis Riley, L.August Saucer, MD Primary Care Provider:  Irven Coe, MD  CC:  ***  HPI:  Jenny Mitchell is a 77 y.o. female here as requested by Clovis Riley, L.August Saucer, MD for dizziness. does not have a problem list on file. In April she had acute onset dizziness and vertigo. Years ago she had the same thing happen. She has been slowing improving since then. MRi showed a stroke. No sins or symptoms of sleep apnea, husband here and provides information no significant snoring, no morning headaches, not excessively tired, will hold off on sleep study. She has popping of the neck, chronic neck pain, imbalance and falls. She had imbalance April. No imbalance since then, dizziness is better. ESS 5 unlikely OSA.    Reviewed notes, labs and imaging from outside physicians, which showed ***  XR neck: 11/22/2021: FINDINGS: Vertebral levels from the level of C1-T1 remain relatively anatomic. No subluxation.   Craniocervical junction unremarkable.   Disc space narrowing most pronounced at C5-C6, C6-C7, C7-T1 with associated endplate sclerosis and anterior osteophyte production.   Uncovertebral joint disease present at all levels.   The right oblique images demonstrate foraminal encroachment at C3-C4, C4-C5, C5-C6 secondary to facet spurring and uncovertebral joint disease.   Left oblique images demonstrate foraminal encroachment at C3-C4, C4-C5, C5-C6 secondary to uncovertebral joint disease and facet spurring.   Facet hypertrophy present bilaterally on the frontal view.   Open mouth odontoid view unremarkable.   No acute displaced fracture identified.   IMPRESSION: Negative for acute bony abnormality.   Disc disease most pronounced at the C5-T1 level.   Foraminal narrowing developing bilaterally spanning C3-C6 as above.  09/07/2023: LDL was 112.   Review of Systems: Patient complains of symptoms per HPI as well  as the following symptoms ***. Pertinent negatives and positives per HPI. All others negative.   Social History   Socioeconomic History   Marital status: Married    Spouse name: Not on file   Number of children: Not on file   Years of education: Not on file   Highest education level: Not on file  Occupational History   Not on file  Tobacco Use   Smoking status: Never   Smokeless tobacco: Never  Vaping Use   Vaping status: Never Used  Substance and Sexual Activity   Alcohol use: No   Drug use: No   Sexual activity: Not on file  Other Topics Concern   Not on file  Social History Narrative   Not on file   Social Drivers of Health   Financial Resource Strain: Not on file  Food Insecurity: Not on file  Transportation Needs: Not on file  Physical Activity: Not on file  Stress: Not on file  Social Connections: Not on file  Intimate Partner Violence: Not on file    Family History  Problem Relation Age of Onset   Parkinsonism Brother     Past Medical History:  Diagnosis Date   Hypertension     There are no active problems to display for this patient.   Past Surgical History:  Procedure Laterality Date   PARTIAL HYSTERECTOMY      Current Outpatient Medications  Medication Sig Dispense Refill   bisoprolol-hydrochlorothiazide (ZIAC) 2.5-6.25 MG tablet Take 1 tablet by mouth daily. 90 tablet 1   famotidine (PEPCID) 40 MG tablet Take 1 tablet (40 mg total) by mouth 2 (two) times daily. 180 tablet 3   meclizine (  ANTIVERT) 25 MG tablet Take 1 tablet (25 mg total) by mouth 3 (three) times daily as needed for dizziness. (Patient not taking: Reported on 11/26/2023) 30 tablet 0   ondansetron (ZOFRAN-ODT) 4 MG disintegrating tablet Take 1 tablet (4 mg total) by mouth every 8 (eight) hours as needed for nausea or vomiting. (Patient not taking: Reported on 11/26/2023) 15 tablet 0   No current facility-administered medications for this visit.    Allergies as of 11/26/2023 -  Review Complete 03/14/2023  Allergen Reaction Noted   Lisinopril Other (See Comments) 06/02/2020    Vitals: BP 113/63 (Patient Position: Supine, Cuff Size: Normal)   Ht 5\' 6"  (1.676 m)   Wt 130 lb (59 kg)   BMI 20.98 kg/m  Last Weight:  Wt Readings from Last 1 Encounters:  11/26/23 130 lb (59 kg)   Last Height:   Ht Readings from Last 1 Encounters:  11/26/23 5\' 6"  (1.676 m)     Physical exam: Exam: Gen: NAD, conversant, well nourised, obese, well groomed                     CV: RRR, no MRG. No Carotid Bruits. No peripheral edema, warm, nontender Eyes: Conjunctivae clear without exudates or hemorrhage  Neuro: Detailed Neurologic Exam  Speech:    Speech is normal; fluent and spontaneous with normal comprehension.  Cognition:    The patient is oriented to person, place, and time;     recent and remote memory intact;     language fluent;     normal attention, concentration,     fund of knowledge Cranial Nerves:    The pupils are equal, round, and reactive to light. The fundi are normal and spontaneous venous pulsations are present. Visual fields are full to finger confrontation. Extraocular movements are intact. Trigeminal sensation is intact and the muscles of mastication are normal. The face is symmetric. The palate elevates in the midline. Hearing intact. Voice is normal. Shoulder shrug is normal. The tongue has normal motion without fasciculations.   Coordination:    Normal finger to nose and heel to shin. Normal rapid alternating movements.   Gait:    Heel-toe and tandem gait are normal.   Motor Observation:    No asymmetry, no atrophy, and no involuntary movements noted. Tone:    Normal muscle tone.    Posture:    Posture is normal. normal erect    Strength:    Strength is V/V in the upper and lower limbs.      Sensation: intact to LT     Reflex Exam:  DTR's:    Deep tendon reflexes in the upper and lower extremities are normal bilaterally.   Toes:     The toes are downgoing bilaterally.   Clonus:    Clonus is absent.    Assessment/Plan:    CLINICAL DATA:  Headaches and imbalance.   EXAM: MRI HEAD WITHOUT AND WITH CONTRAST   TECHNIQUE: Multiplanar, multiecho pulse sequences of the brain and surrounding structures were obtained without and with intravenous contrast.   CONTRAST:  6 mL of Vueway   COMPARISON:  None Available.   FINDINGS: Brain: There is no evidence of an acute infarct, intracranial hemorrhage, mass, midline shift, or extra-axial fluid collection. T2 hyperintensities in the cerebral white matter are nonspecific but compatible with mild chronic small vessel ischemic disease. There is a small chronic infarct peripherally in the lateral aspect of the right cerebellar hemisphere. Mild cerebral atrophy is within normal  limits for age. No abnormal enhancement is identified.   Vascular: Major intracranial vascular flow voids are preserved.   Skull and upper cervical spine: No suspicious marrow lesion. Advanced right facet arthrosis at C4-5.   Sinuses/Orbits: Bilateral cataract extraction. Paranasal sinuses and mastoid air cells are clear.   Other: None.   IMPRESSION: 1. No acute intracranial abnormality. 2. Mild chronic small vessel ischemic disease. 3. Small chronic right cerebellar infarct.    No orders of the defined types were placed in this encounter.  No orders of the defined types were placed in this encounter.   Cc: Clovis Riley, L.August Saucer, MD,  Irven Coe, MD  Naomie Dean, MD  [facility] [address] [place], [place] [address]  Phone [phone] Fax [phone]

## 2023-11-26 NOTE — Patient Instructions (Addendum)
Daily baby aspirin for stroke protection 09/07/2023: LDL was 112. Stroke association recommends LDL < 70. Recommend Lipitor 10mg  daily. Will think about it at this time she states she will research.  CT Angiogram of the head and neck 30-day holter monitor Echocardiogram heart If all is negative may recommend [facility] for vestibular therapy [facility] next door  Yes, dizziness is a common symptom of a cerebellar stroke, and it can be the only symptom that appears at first:  Symptoms Dizziness is the most common symptom of a cerebellar stroke, and it's often accompanied by headaches or balance problems. Symptoms can affect the opposite side of the body from the side of the cerebellum that's affected.  Causes A cerebellar stroke can disrupt the signals that help you maintain balance and coordination. This can lead to dizziness, and it can also impact your vision.  When to seek medical attention Because the symptoms of a cerebellar stroke can be easy to ignore, you should seek medical attention if you experience dizziness or other symptoms suddenly.  Cerebellar stroke symptoms can include: Movement issues: Difficulty walking or moving, unsteadiness, or loss of coordination  Balance problems: Feeling like you're spinning while standing still, or vertigo  Nausea and vomiting: More than half of cerebellar strokes cause nausea and vomiting  Ischemic Stroke  An ischemic stroke (cerebrovascular accident, CVA) occurs when an area of the brain does not get enough blood flow. This leads to the sudden death of brain tissue and can cause brain damage. An ischemic stroke is a medical emergency. It must be treated right away. What are the causes? This condition is caused by a decrease of blood flow to a part of the brain. This may be due to: A small blood clot (embolus). A buildup of plaque in the blood vessels (atherosclerosis). This blocks blood flow in the brain. An abnormal  heart rhythm, called atrial fibrillation (AFib). This sends a small blood clot to the brain. A blocked or damaged artery in the head or neck. Certain infections. Inflammation of the arteries in the brain (vasculitis). Sometimes, the cause of ischemic stroke is not known. What increases the risk? The following medical conditions may increase your risk of a stroke: High blood pressure (hypertension). Heart disease. Diabetes. High cholesterol. Obesity. Sleep problems (sleep apnea). Other risk factors that you can change include: Using products that contain nicotine or tobacco. Not being active. Heavy use of alcohol or drugs, especially cocaine and methamphetamine. Taking birth control pills, especially if you also use tobacco. Risk factors that you cannot change include: Being older than age 50. Having a history of blood clots, stroke, or mini-stroke (transient ischemic attack, TIA). Having a family history of stroke. What are the signs or symptoms? Symptoms of this condition usually develop suddenly, or you may notice them after waking from sleep. These may include: Weakness or numbness of your face, arm, or leg, especially on one side of your body. Loss of balance or coordination. Slurred speech, trouble speaking, trouble understanding speech, or a combination of these. Vision changes. You may have double vision, blurred vision, or loss of vision. Dizziness or confusion. Nausea and vomiting. Severe headache. If possible, write down the exact time your symptoms started. Tell your health care provider. If symptoms come and go, they could be signs of a TIA. Get help right away, even if you feel better. How is this diagnosed? This condition may be diagnosed based on: Your symptoms, your medical history, and a physical exam.  CT scan of the brain. MRI. Imaging tests that scan blood flow in the brain (CT angiogram, MRI angiogram, or cerebral angiogram). You may also have other tests,  including: Electrocardiogram (ECG). Continuous heart monitoring. Transthoracic echocardiogram (TTE). Transesophageal echocardiogram (TEE). Carotid ultrasound. Blood tests. Sleep study to check for sleep apnea. You may need to see a health care provider who specializes in stroke care. How is this treated? Treatment for this condition depends on the area of the brain affected and the cause of your symptoms. Some treatments work better if they are done within 3-6 hours of the start of symptoms. These may include: Medicine that is injected to dissolve the blood clot. Treatments given directly to the affected artery to remove or dissolve the blood clot. Medicines to control blood pressure. Medicines to thin the blood (anticoagulant or antiplatelet). Other treatments may include: Oxygen. IV fluids. Procedures to increase blood flow. After a stroke, you may work with physical, speech, mental health, or occupational therapists to help you recover. Follow these instructions at home: Medicines Take over-the-counter and prescription medicines only as told by your health care provider. If you were told to take a medicine to thin your blood, take it exactly as told, at the same time every day. Taking too much of a blood thinner can cause bleeding. Taking too little may not protect you against a stroke and other problems. If you were not prescribed medicines that contain aspirin or NSAIDs, such as ibuprofen, talk with your health care provider before you take any of these. These medicines increase your risk for dangerous bleeding. When taking a blood thinner, do these things: Hold pressure over any cuts that bleed for longer than usual. Tell your dentist and other health care providers that you are taking anticoagulants before having any procedures that may cause bleeding. Avoid activities that could cause injury or bruising. Wear a medical alert bracelet or carry a card that lists what medicines you  take. Eating and drinking Follow instructions from your health care provider about diet. Eat healthy foods. If your stroke affected your ability to swallow, you may need to take steps to avoid choking. These may include: Taking small bites of food. Eating soft or pureed foods. Safety Follow instructions from your health care team about physical activity. Use a walker or cane as told by your health care provider. Take steps to lower your risk of falls at home. These may include: Installing grab bars in the bedroom and bathroom. Using raised toilets and putting a seat in the shower. Removing clutter and tripping hazards, such as cords or area rugs. General instructions Do not use any products that contain nicotine or tobacco. These include cigarettes, chewing tobacco, and vaping devices, such as e-cigarettes. If you need help quitting, ask your health care provider. If you drink alcohol: Limit how much you have to: 0-1 drink a day for women who are not pregnant. 0-2 drinks a day for men. Know how much alcohol is in your drink. In the [place] one drink equals one 12 oz bottle of beer (355 mL), one 5 oz glass of wine (148 mL), or one 1 oz glass of hard liquor (44 mL). Keep all follow-up visits. This is important. How is this prevented? You can lower your risk of another stroke by managing these conditions: High blood pressure. High cholesterol. Diabetes. Heart disease. Sleep apnea. Obesity. Quitting smoking, limiting alcohol, and staying physically active also will reduce your risk. Your health care provider will continue to help  you with ways to prevent short-term and long-term problems caused by stroke. Get help right away if: You have any symptoms of a stroke. "BE FAST" is an easy way to remember the main warning signs of a stroke: B - Balance. Signs are dizziness, sudden trouble walking, or loss of balance. E - Eyes. Signs are trouble seeing or a sudden change in vision. F - Face.  Signs are sudden weakness or numbness of the face, or the face or eyelid drooping on one side. A - Arms. Signs are weakness or numbness in an arm. This happens suddenly and usually on one side of the body. S - Speech. Signs are sudden trouble speaking, slurred speech, or trouble understanding what people say. T - Time. Time to call emergency services. Write down what time symptoms started. You have other signs of a stroke, such as: A sudden, severe headache with no known cause. Nausea or vomiting. Seizure. These symptoms may represent a serious problem that is an emergency. Do not wait to see if the symptoms will go away. Get medical help right away. Call your local emergency services (911 in the [place] Do not drive yourself to the hospital. Summary An ischemic stroke (cerebrovascular accident, CVA) occurs when an area of the brain does not get enough blood flow. Symptoms of this condition usually develop suddenly, or you may notice them after waking from sleep. It is very important to get treatment at the first sign of stroke symptoms. Stroke is a medical emergency that must be treated right away. This information is not intended to replace advice given to you by your health care provider. Make sure you discuss any questions you have with your health care provider. Document Revised: 06/23/2020 Document Reviewed: 06/23/2020 Elsevier Patient Education  2022 [employer].

## 2023-11-27 LAB — HEMOGLOBIN A1C
Est. average glucose Bld gHb Est-mCnc: 111 mg/dL
Hgb A1c MFr Bld: 5.5 % (ref 4.8–5.6)

## 2023-11-27 LAB — BASIC METABOLIC PANEL
BUN/Creatinine Ratio: 16 (ref 12–28)
BUN: 15 mg/dL (ref 8–27)
CO2: 24 mmol/L (ref 20–29)
Calcium: 9.7 mg/dL (ref 8.7–10.3)
Chloride: 101 mmol/L (ref 96–106)
Creatinine, Ser: 0.94 mg/dL (ref 0.57–1.00)
Glucose: 79 mg/dL (ref 70–99)
Potassium: 4 mmol/L (ref 3.5–5.2)
Sodium: 140 mmol/L (ref 134–144)
eGFR: 62 mL/min/{1.73_m2} (ref >59)

## 2023-11-29 ENCOUNTER — Encounter: Payer: Self-pay | Admitting: Neurology

## 2023-11-29 DIAGNOSIS — I63439 Cerebral infarction due to embolism of unspecified posterior cerebral artery: Secondary | ICD-10-CM | POA: Insufficient documentation

## 2023-11-29 DIAGNOSIS — R42 Dizziness and giddiness: Secondary | ICD-10-CM | POA: Insufficient documentation

## 2023-12-03 ENCOUNTER — Other Ambulatory Visit (HOSPITAL_COMMUNITY): Payer: Self-pay

## 2023-12-03 MED ORDER — KETOCONAZOLE 2 % EX CREA
1.0000 | TOPICAL_CREAM | Freq: Every day | CUTANEOUS | 0 refills | Status: AC
  Filled 2023-12-03: qty 15, 15d supply, fill #0

## 2023-12-04 ENCOUNTER — Telehealth: Payer: Self-pay | Admitting: Neurology

## 2023-12-04 NOTE — Telephone Encounter (Signed)
medicare/BC[employer] federal NPR sent to [facility] [phone]

## 2023-12-05 DIAGNOSIS — I4891 Unspecified atrial fibrillation: Secondary | ICD-10-CM

## 2023-12-05 DIAGNOSIS — R2689 Other abnormalities of gait and mobility: Secondary | ICD-10-CM | POA: Diagnosis not present

## 2023-12-05 DIAGNOSIS — I639 Cerebral infarction, unspecified: Secondary | ICD-10-CM | POA: Diagnosis not present

## 2023-12-05 DIAGNOSIS — R42 Dizziness and giddiness: Secondary | ICD-10-CM | POA: Diagnosis not present

## 2023-12-05 DIAGNOSIS — I63439 Cerebral infarction due to embolism of unspecified posterior cerebral artery: Secondary | ICD-10-CM | POA: Diagnosis not present

## 2023-12-07 ENCOUNTER — Ambulatory Visit: Payer: Medicare Other | Attending: Neurology

## 2023-12-07 DIAGNOSIS — I639 Cerebral infarction, unspecified: Secondary | ICD-10-CM | POA: Diagnosis not present

## 2023-12-07 DIAGNOSIS — R42 Dizziness and giddiness: Secondary | ICD-10-CM | POA: Insufficient documentation

## 2023-12-07 DIAGNOSIS — I63439 Cerebral infarction due to embolism of unspecified posterior cerebral artery: Secondary | ICD-10-CM | POA: Diagnosis not present

## 2023-12-07 DIAGNOSIS — R2689 Other abnormalities of gait and mobility: Secondary | ICD-10-CM | POA: Diagnosis not present

## 2023-12-07 DIAGNOSIS — R2681 Unsteadiness on feet: Secondary | ICD-10-CM | POA: Diagnosis not present

## 2023-12-07 NOTE — Therapy (Signed)
 OUTPATIENT PHYSICAL THERAPY VESTIBULAR EVALUATION     Patient Name: Jenny Mitchell MRN: [id number] DOB:21-Jul-1946, 78 y.o., female Today's Date: 12/07/2023  END OF SESSION:  PT End of Session - 12/07/23 1006     Visit Number 1    Number of Visits 1    Authorization Type Medicare    Progress Note Due on Visit 10    PT Start Time 1015    PT Stop Time 1048    PT Time Calculation (min) 33 min    Equipment Utilized During Treatment Gait belt    Activity Tolerance Patient tolerated treatment well    Behavior During Therapy WFL for tasks assessed/performed             Past Medical History:  Diagnosis Date   Hypertension    Past Surgical History:  Procedure Laterality Date   PARTIAL HYSTERECTOMY     Patient Active Problem List   Diagnosis Date Noted   Cerebrovascular accident (CVA) due to embolism of posterior cerebral artery (HCC) 11/29/2023   Dizziness 11/29/2023   Vertigo 11/29/2023    PCP: Cheryle Frees, MD REFERRING PROVIDER: Ines Onetha Bomani Oommen, MD  REFERRING DIAG: [phone] (ICD-10-CM) - Cerebrovascular accident (CVA) due to embolism of posterior cerebral artery, unspecified blood vessel laterality (HCC) R42 (ICD-10-CM) - Dizziness R26.89 (ICD-10-CM) - Imbalance R42 (ICD-10-CM) - Vertigo I63.9 (ICD-10-CM) - Cerebellar stroke (HCC)  THERAPY DIAG:  Unsteadiness on feet - Plan: PT plan of care cert/re-cert  Dizziness and giddiness - Plan: PT plan of care cert/re-cert  Other abnormalities of gait and mobility - Plan: PT plan of care cert/re-cert  ONSET DATE: 11/26/2023 referral  Rationale for Evaluation and Treatment: Rehabilitation  SUBJECTIVE:   SUBJECTIVE STATEMENT: Patient arrives to clinic with husband, no AD. Reports that at times she would have a balance problem. States that in the morning, when she would first wake up, she would wobble a little. Last April she had room-spinning dizziness and HA, gradually that subsided. Has not experienced any  dizziness or any imbalance in a few months.  Pt accompanied by: significant other  PERTINENT HISTORY: HTN  PAIN:  Are you having pain? No  PRECAUTIONS: Fall   WEIGHT BEARING RESTRICTIONS: No  FALLS: Has patient fallen in last 6 months? No  LIVING ENVIRONMENT: Lives with: lives with their family Lives in: House/apartment Stairs: Yes: External: 2-4 steps; none Has following equipment at home: Crutches  PLOF: Independent driving  PATIENT GOALS: to see where I am  OBJECTIVE:  Note: Objective measures were completed at Evaluation unless otherwise noted.  DIAGNOSTIC FINDINGS: 08/21/23 brain MRI IMPRESSION: 1. No acute intracranial abnormality. 2. Mild chronic small vessel ischemic disease. 3. Small chronic right cerebellar infarct.  COGNITION: Overall cognitive status: Within functional limits for tasks assessed  POSTURE:  No Significant postural limitations  Cervical ROM:   WFL, intermittent popping   STRENGTH: WFL   BED MOBILITY:  No difficulty, no dizziness  TRANSFERS: Assistive device utilized: None  Sit to stand: Complete Independence Stand to sit: Complete Independence Chair to chair: Complete Independence    GAIT: Gait pattern: WFL Distance walked: clinic   PATIENT SURVEYS:  FOTO 59, expected to be at 38  VESTIBULAR ASSESSMENT:  GENERAL OBSERVATION: no AD, no dizziness   SYMPTOM BEHAVIOR:  Subjective history: Has not had dizziness in many months  Progression of symptoms: better  OCULOMOTOR EXAM:  Ocular Alignment: normal  Ocular ROM: No Limitations  Spontaneous Nystagmus: absent  Gaze-Induced Nystagmus: absent  Smooth Pursuits: intact  Saccades: intact  Convergence/Divergence: <5 cm   VESTIBULAR - OCULAR REFLEX:   Slow VOR: Normal  VOR Cancellation: Normal  Head-Impulse Test: HIT Right: positive, but no symptom report HIT Left: positive, but no symptom report   POSITIONAL TESTING: not indicated based on subjective  report  MOTION SENSITIVITY:  Motion Sensitivity Quotient Intensity: 0 = none, 1 = Lightheaded, 2 = Mild, 3 = Moderate, 4 = Severe, 5 = Vomiting  Intensity  1. Sitting to supine   2. Supine to L side   3. Supine to R side   4. Supine to sitting   5. L Hallpike-Dix   6. Up from L    7. R Hallpike-Dix   8. Up from R    9. Sitting, head tipped to L knee 0  10. Head up from L knee 0  11. Sitting, head tipped to R knee 0  12. Head up from R knee 0  13. Sitting head turns x5 0  14.Sitting head nods x5 0  15. In stance, 180 turn to L  0  16. In stance, 180 turn to R 0    MCTSIB Condition 1: 30s no sway Condition 2: 30s normal sway Condition 3: 30s normal sway Condition 4: 30s mild sway  FUNCTIONAL GAIT  [facility] PT Assessment - 12/07/23 0001       Functional Gait  Assessment   Gait assessed  Yes    Gait Level Surface Walks 20 ft in less than 5.5 sec, no assistive devices, good speed, no evidence for imbalance, normal gait pattern, deviates no more than 6 in outside of the 12 in walkway width.    Change in Gait Speed Able to smoothly change walking speed without loss of balance or gait deviation. Deviate no more than 6 in outside of the 12 in walkway width.    Gait with Horizontal Head Turns Performs head turns smoothly with no change in gait. Deviates no more than 6 in outside 12 in walkway width    Gait with Vertical Head Turns Performs head turns with no change in gait. Deviates no more than 6 in outside 12 in walkway width.    Gait and Pivot Turn Pivot turns safely within 3 sec and stops quickly with no loss of balance.    Step Over Obstacle Is able to step over 2 stacked shoe boxes taped together (9 in total height) without changing gait speed. No evidence of imbalance.    Gait with Narrow Base of Support Ambulates 7-9 steps.    Gait with Eyes Closed Walks 20 ft, uses assistive device, slower speed, mild gait deviations, deviates 6-10 in outside 12 in walkway width. Ambulates 20  ft in less than 9 sec but greater than 7 sec.    Ambulating Backwards Walks 20 ft, no assistive devices, good speed, no evidence for imbalance, normal gait    Steps Alternating feet, no rail.    Total Score 28  TREATMENT N/a eval  PATIENT EDUCATION: Education details: PT POC, exam findings, fall precautions in the dark Person educated: Patient and Spouse Education method: Explanation Education comprehension: verbalized understanding  GOALS: Not indicated as patient does not require skilled PT services tat this time  ASSESSMENT:  CLINICAL IMPRESSION: Patient is a 78 y.o. female who was seen today for physical therapy evaluation and treatment for episodic dizziness and imbalance. Her last reported episode of dizziness and imbalance was this past April. She states that by the time she got her dr appts, her symptoms had passed. She has not experienced this for many months. On exam, she has a normal vestibular presentation with no provocation of her dizziness. Patient scored a 28/30 on  Functional Gait Assessment.   <22/30 = predictive of falls, <20/30 = fall in 6 months, <18/30 = predictive of falls in PD MCID: 5 points stroke population, 4 points geriatric population (ANPTA Core Set of Outcome Measures for Adults with Neurologic Conditions, 2018). Given the above, she does not require skilled PT services at this time.      CLINICAL DECISION MAKING: Stable/uncomplicated  EVALUATION COMPLEXITY: Low   PLAN:  PT FREQUENCY: one time visit  PT DURATION: other: 1x visit    Delon DELENA Pop, PT Delon DELENA Pop, PT, DPT, CBIS  12/07/2023, 11:00 AM

## 2023-12-12 ENCOUNTER — Telehealth: Payer: Self-pay

## 2023-12-12 NOTE — Telephone Encounter (Signed)
 Spoke with patient and she is wearing a 30 day monitor. Informed patient depending on where monitor is placed she may not have to remove monitor. She will bring extra adhesives just in case she does have to remove it and re-apply it. She verbalized understanding

## 2023-12-12 NOTE — Telephone Encounter (Signed)
 New Message:      Patient is scheduled for an Echo. She is wearing a Monitor, wants to know if she can still have the Echo?

## 2023-12-24 ENCOUNTER — Ambulatory Visit (HOSPITAL_COMMUNITY): Payer: Medicare Other | Attending: Cardiology

## 2023-12-24 ENCOUNTER — Encounter: Payer: Self-pay | Admitting: Neurology

## 2023-12-24 DIAGNOSIS — I63439 Cerebral infarction due to embolism of unspecified posterior cerebral artery: Secondary | ICD-10-CM

## 2023-12-24 DIAGNOSIS — I4891 Unspecified atrial fibrillation: Secondary | ICD-10-CM | POA: Diagnosis not present

## 2023-12-24 DIAGNOSIS — R2689 Other abnormalities of gait and mobility: Secondary | ICD-10-CM | POA: Diagnosis not present

## 2023-12-24 DIAGNOSIS — R42 Dizziness and giddiness: Secondary | ICD-10-CM | POA: Diagnosis not present

## 2023-12-24 DIAGNOSIS — I639 Cerebral infarction, unspecified: Secondary | ICD-10-CM | POA: Diagnosis not present

## 2023-12-24 LAB — ECHOCARDIOGRAM COMPLETE BUBBLE STUDY
Area-P 1/2: 2.87 cm2
S' Lateral: 2 cm

## 2023-12-27 ENCOUNTER — Ambulatory Visit
Admission: RE | Admit: 2023-12-27 | Discharge: 2023-12-27 | Disposition: A | Discharge status: Home / Self Care | Payer: Medicare Other | Source: Ambulatory Visit | Attending: Neurology | Admitting: Neurology

## 2023-12-27 DIAGNOSIS — R2689 Other abnormalities of gait and mobility: Secondary | ICD-10-CM

## 2023-12-27 DIAGNOSIS — I4891 Unspecified atrial fibrillation: Secondary | ICD-10-CM

## 2023-12-27 DIAGNOSIS — I639 Cerebral infarction, unspecified: Secondary | ICD-10-CM

## 2023-12-27 DIAGNOSIS — R42 Dizziness and giddiness: Secondary | ICD-10-CM

## 2023-12-27 DIAGNOSIS — I63439 Cerebral infarction due to embolism of unspecified posterior cerebral artery: Secondary | ICD-10-CM

## 2023-12-27 DIAGNOSIS — Z09 Encounter for follow-up examination after completed treatment for conditions other than malignant neoplasm: Secondary | ICD-10-CM | POA: Diagnosis not present

## 2023-12-27 MED ORDER — IOPAMIDOL (ISOVUE-370) INJECTION 76%
100.0000 mL | Freq: Once | INTRAVENOUS | Status: AC | PRN
  Administered 2023-12-27: 75 mL via INTRAVENOUS

## 2024-01-07 ENCOUNTER — Encounter: Payer: Self-pay | Admitting: Neurology

## 2024-01-07 ENCOUNTER — Ambulatory Visit: Payer: Medicare Other | Attending: Neurology

## 2024-01-07 DIAGNOSIS — I4891 Unspecified atrial fibrillation: Secondary | ICD-10-CM

## 2024-01-07 DIAGNOSIS — I639 Cerebral infarction, unspecified: Secondary | ICD-10-CM

## 2024-01-07 DIAGNOSIS — R42 Dizziness and giddiness: Secondary | ICD-10-CM

## 2024-01-07 DIAGNOSIS — R2689 Other abnormalities of gait and mobility: Secondary | ICD-10-CM

## 2024-01-07 DIAGNOSIS — I63439 Cerebral infarction due to embolism of unspecified posterior cerebral artery: Secondary | ICD-10-CM

## 2024-01-09 ENCOUNTER — Encounter: Payer: Self-pay | Admitting: Neurology

## 2024-03-05 DIAGNOSIS — R809 Proteinuria, unspecified: Secondary | ICD-10-CM | POA: Diagnosis not present

## 2024-03-05 DIAGNOSIS — E7800 Pure hypercholesterolemia, unspecified: Secondary | ICD-10-CM | POA: Diagnosis not present

## 2024-03-05 DIAGNOSIS — M8588 Other specified disorders of bone density and structure, other site: Secondary | ICD-10-CM | POA: Diagnosis not present

## 2024-03-05 DIAGNOSIS — M8580 Other specified disorders of bone density and structure, unspecified site: Secondary | ICD-10-CM | POA: Diagnosis not present

## 2024-03-05 DIAGNOSIS — I10 Essential (primary) hypertension: Secondary | ICD-10-CM | POA: Diagnosis not present

## 2024-03-07 ENCOUNTER — Other Ambulatory Visit (HOSPITAL_COMMUNITY): Payer: Self-pay

## 2024-03-07 DIAGNOSIS — I10 Essential (primary) hypertension: Secondary | ICD-10-CM | POA: Diagnosis not present

## 2024-03-07 DIAGNOSIS — L309 Dermatitis, unspecified: Secondary | ICD-10-CM | POA: Diagnosis not present

## 2024-03-07 DIAGNOSIS — N3281 Overactive bladder: Secondary | ICD-10-CM | POA: Diagnosis not present

## 2024-03-07 DIAGNOSIS — K219 Gastro-esophageal reflux disease without esophagitis: Secondary | ICD-10-CM | POA: Diagnosis not present

## 2024-03-07 DIAGNOSIS — M8588 Other specified disorders of bone density and structure, other site: Secondary | ICD-10-CM | POA: Diagnosis not present

## 2024-03-07 DIAGNOSIS — R809 Proteinuria, unspecified: Secondary | ICD-10-CM | POA: Diagnosis not present

## 2024-03-07 DIAGNOSIS — E7800 Pure hypercholesterolemia, unspecified: Secondary | ICD-10-CM | POA: Diagnosis not present

## 2024-03-07 DIAGNOSIS — N1831 Chronic kidney disease, stage 3a: Secondary | ICD-10-CM | POA: Diagnosis not present

## 2024-03-07 MED ORDER — TRIAMCINOLONE ACETONIDE 0.1 % EX OINT
1.0000 | TOPICAL_OINTMENT | Freq: Two times a day (BID) | CUTANEOUS | 3 refills | Status: AC
  Filled 2024-03-07: qty 80, 30d supply, fill #0

## 2024-03-24 ENCOUNTER — Telehealth: Payer: Self-pay | Admitting: Neurology

## 2024-03-24 ENCOUNTER — Encounter: Payer: Self-pay | Admitting: Neurology

## 2024-03-24 NOTE — Telephone Encounter (Signed)
 Unable to reach pt over the phone, sent mychart msg and letter in mail informing pt of need to reschedule 06/09/24 appt - MD out  If patient calls back you can offer an appointment with Dr. Tresia Fruit for the end of August

## 2024-05-22 ENCOUNTER — Other Ambulatory Visit: Payer: Medicare Other

## 2024-05-22 ENCOUNTER — Ambulatory Visit
Admission: RE | Admit: 2024-05-22 | Discharge: 2024-05-22 | Disposition: A | Discharge status: Home / Self Care | Payer: Medicare Other | Source: Ambulatory Visit | Attending: Family Medicine | Admitting: Family Medicine

## 2024-05-22 DIAGNOSIS — Z1231 Encounter for screening mammogram for malignant neoplasm of breast: Secondary | ICD-10-CM

## 2024-06-09 ENCOUNTER — Ambulatory Visit: Payer: Medicare Other | Admitting: Neurology

## 2024-06-26 ENCOUNTER — Other Ambulatory Visit (HOSPITAL_BASED_OUTPATIENT_CLINIC_OR_DEPARTMENT_OTHER)

## 2024-07-02 DIAGNOSIS — G479 Sleep disorder, unspecified: Secondary | ICD-10-CM | POA: Diagnosis not present

## 2024-07-02 DIAGNOSIS — I10 Essential (primary) hypertension: Secondary | ICD-10-CM | POA: Diagnosis not present

## 2024-07-02 DIAGNOSIS — M79601 Pain in right arm: Secondary | ICD-10-CM | POA: Diagnosis not present

## 2024-07-22 ENCOUNTER — Encounter: Payer: Self-pay | Admitting: Neurology

## 2024-07-22 ENCOUNTER — Ambulatory Visit (INDEPENDENT_AMBULATORY_CARE_PROVIDER_SITE_OTHER): Admitting: Neurology

## 2024-07-22 VITALS — BP 118/64 | HR 55 | Ht 66.0 in | Wt 131.4 lb

## 2024-07-22 DIAGNOSIS — I63439 Cerebral infarction due to embolism of unspecified posterior cerebral artery: Secondary | ICD-10-CM | POA: Diagnosis not present

## 2024-07-22 DIAGNOSIS — R42 Dizziness and giddiness: Secondary | ICD-10-CM | POA: Diagnosis not present

## 2024-07-22 DIAGNOSIS — R001 Bradycardia, unspecified: Secondary | ICD-10-CM | POA: Insufficient documentation

## 2024-07-22 NOTE — Progress Notes (Signed)
 [facility]    Provider:  Dr Ines Requesting Provider: Leonel Cole, MD Primary Care Provider:  Leonel Cole, MD  CC:  dizziness  07/22/2024: Here with husband: Doing well. No episodes. No dizziness. No chest pain, no shortness of breath. No vertigo. No significant imbalance. No smoking. No coughing or brathing difficulties. Average hr was 43 bpm. Sent to Dr. Leonel unclear if she needs a cardiology referral. Dr. Leonel is pcp. Recommended cardiology eval and possibly loop recorder for afib. 43 is very low, may need a pacemaker however is aymptomatic. She declines all of the above.No other focal neurologic deficits, associated symptoms, inciting events or modifiable factors.Patient complains of symptoms per HPI as well as the following symptoms: none . Pertinent negatives and positives per HPI. All others negative    HPI: 11/26/2023  Jenny Mitchell is a 78 y.o. female here as requested by Leonel Cole, MD for dizziness. has Cerebrovascular accident (CVA) due to embolism of posterior cerebral artery (HCC); Dizziness; Vertigo; and Bradycardia on their problem list. In April she had acute onset dizziness and vertigo. Years ago she had the same thing happen. She has been slowing improving since then. MRi showed a stroke. No sins or symptoms of sleep apnea, husband here and provides information no significant snoring, no morning headaches, not excessively tired, will hold off on sleep study. She has popping of the neck, chronic neck pain, imbalance and falls. She had imbalance April. No imbalance since then, dizziness is better. ESS 5 unlikely OSA. No other focal neurologic deficits, associated symptoms, inciting events or modifiable factors.   Reviewed notes, labs and imaging from outside physicians, which showed:  XR neck: 11/22/2021: FINDINGS: Vertebral levels from the level of C1-T1 remain relatively anatomic. No subluxation.   Craniocervical junction unremarkable.    Disc space narrowing most pronounced at C5-C6, C6-C7, C7-T1 with associated endplate sclerosis and anterior osteophyte production.   Uncovertebral joint disease present at all levels.   The right oblique images demonstrate foraminal encroachment at C3-C4, C4-C5, C5-C6 secondary to facet spurring and uncovertebral joint disease.   Left oblique images demonstrate foraminal encroachment at C3-C4, C4-C5, C5-C6 secondary to uncovertebral joint disease and facet spurring.   Facet hypertrophy present bilaterally on the frontal view.   Open mouth odontoid view unremarkable.   No acute displaced fracture identified.   IMPRESSION: Negative for acute bony abnormality.   Disc disease most pronounced at the C5-T1 level.   Foraminal narrowing developing bilaterally spanning C3-C6 as above.  09/07/2023: LDL was 112.    CLINICAL DATA:  Headaches and imbalance.   EXAM: MRI HEAD WITHOUT AND WITH CONTRAST   TECHNIQUE: Multiplanar, multiecho pulse sequences of the brain and surrounding structures were obtained without and with intravenous contrast.   CONTRAST:  6 mL of Vueway    COMPARISON:  None Available.   FINDINGS: Brain: There is no evidence of an acute infarct, intracranial hemorrhage, mass, midline shift, or extra-axial fluid collection. T2 hyperintensities in the cerebral white matter are nonspecific but compatible with mild chronic small vessel ischemic disease. There is a small chronic infarct peripherally in the lateral aspect of the right cerebellar hemisphere. Mild cerebral atrophy is within normal limits for age. No abnormal enhancement is identified.   Vascular: Major intracranial vascular flow voids are preserved.   Skull and upper cervical spine: No suspicious marrow lesion. Advanced right facet arthrosis at C4-5.   Sinuses/Orbits: Bilateral cataract extraction. Paranasal sinuses and mastoid air cells are clear.   Other: None.  IMPRESSION: 1. No acute  intracranial abnormality. 2. Mild chronic small vessel ischemic disease. 3. Small chronic right cerebellar infarct.  Review of Systems: Patient complains of symptoms per HPI as well as the following symptoms none. Pertinent negatives and positives per HPI. All others negative.   Social History   Socioeconomic History   Marital status: Married    Spouse name: Not on file   Number of children: Not on file   Years of education: Not on file   Highest education level: Not on file  Occupational History   Not on file  Tobacco Use   Smoking status: Never   Smokeless tobacco: Never  Vaping Use   Vaping status: Never Used  Substance and Sexual Activity   Alcohol use: No   Drug use: No   Sexual activity: Not on file  Other Topics Concern   Not on file  Social History Narrative   Pt lives with husband    Retired    [employer] Strain: Not on file  Food Insecurity: Not on file  Transportation Needs: Not on file  Physical Activity: Not on file  Stress: Not on file  Social Connections: Not on file  Intimate Partner Violence: Not on file    Family History  Problem Relation Age of Onset   Parkinsonism Brother    BRCA 1/2 Neg Hx    Breast cancer Neg Hx    Stroke Neg Hx     Past Medical History:  Diagnosis Date   Hypertension     Patient Active Problem List   Diagnosis Date Noted   Bradycardia 07/22/2024   Cerebrovascular accident (CVA) due to embolism of posterior cerebral artery (HCC) 11/29/2023   Dizziness 11/29/2023   Vertigo 11/29/2023    Past Surgical History:  Procedure Laterality Date   ABDOMINAL HYSTERECTOMY     PARTIAL HYSTERECTOMY      Current Outpatient Medications  Medication Sig Dispense Refill   aspirin  EC 81 MG tablet Take 1 tablet (81 mg total) by mouth daily. Swallow whole.     bisoprolol -hydrochlorothiazide  (ZIAC ) 2.5-6.25 MG tablet Take 1 tablet by mouth daily. 90 tablet 1   famotidine  (PEPCID ) 40 MG tablet  Take 1 tablet (40 mg total) by mouth 2 (two) times daily. 180 tablet 3   ketoconazole  (NIZORAL ) 2 % cream Apply 1 Application topically daily. (Patient taking differently: Apply 1 Application topically as needed.) 15 g 0   triamcinolone  ointment (KENALOG ) 0.1 % Apply 1 Application topically 2 (two) times daily. (Patient taking differently: Apply 1 Application topically as needed.) 80 g 3   No current facility-administered medications for this visit.    Allergies as of 07/22/2024 - Review Complete 07/22/2024  Allergen Reaction Noted   Lisinopril Other (See Comments) 06/02/2020    Vitals: BP 118/64   Pulse (!) 55   Ht 5' 6 (1.676 m)   Wt 131 lb 6.4 oz (59.6 kg)   BMI 21.21 kg/m  Last Weight:  Wt Readings from Last 1 Encounters:  07/22/24 131 lb 6.4 oz (59.6 kg)   Last Height:   Ht Readings from Last 1 Encounters:  07/22/24 5' 6 (1.676 m)     Physical exam: Exam: Gen: NAD, conversant      CV: No palpitations or chest pain or SOB. VS: Breathing at a normal rate. Not febrile. Eyes: Conjunctivae clear without exudates or hemorrhage  Neuro: Detailed Neurologic Exam  Speech:    Speech is normal; fluent and spontaneous  with normal comprehension.  Cognition:    The patient is oriented to person, place, and time;     recent and remote memory intact;     language fluent;     normal attention, concentration, fund of knowledge Cranial Nerves:    The pupils are equal, round, and reactive to light. Visual fields are full Extraocular movements are intact.  The face is symmetric with normal sensation. The palate elevates in the midline. Hearing intact. Voice is normal. Shoulder shrug is normal. The tongue has normal motion without fasciculations.   Coordination: normal  Gait:    No abnormalities noted or reported  Motor Observation:   no involuntary movements noted. Tone:    Appears normal  Posture:    Posture is normal. normal erect    Strength:    Strength is  anti-gravity and symmetric in the upper and lower limbs.      Sensation: intact to LT, no reports of numbness or tingling or paresthesias         Assessment/Plan:  Patient with cerebellar stroke, dizziness, vertigo. Uncelar if stroke happened during her episode in April, MRi was taken later and stroke is chronic cannot tell but in the cerebellum which could have caused her symptoms.   Continue Daily baby aspirin  for stroke protection 09/07/2023: LDL was 112. Stroke association recommends LDL < 70. Recommend Lipitor 10mg  daily: she declines.  CT Angiogram H&N: unremarkable: CT shows some chronic changes in the lumgs (bronchietasis) prior infections possibly? F/u with primary care, she states she will discuss with him at upcoming appointment 30-day holter monitor - Low heart rate average 43 recommend cardiology per pcp recommendations, You are asymptomatic so unclear plesae discuss with pcp, states she will discuss with him Echocardiogram heart for structural causes of stroke or clots/pfo - unremarkable Discussed Loop Recorder - she declines F/u as needed   No orders of the defined types were placed in this encounter.  No orders of the defined types were placed in this encounter.   Cc: Leonel Cole, MD,  Leonel Cole, MD  Onetha Epp, MD  [facility] [address] [place], [place] [address]  I spent 22 minutes of face-to-face and non-face-to-face time with patient on the  1. Cerebrovascular accident (CVA) due to embolism of posterior cerebral artery, unspecified blood vessel laterality (HCC)   2. Vertigo   3. Dizziness   4. Bradycardia    diagnosis.  This included previsit chart review, lab review, study review, order entry, electronic health record documentation, patient education on the different diagnostic and therapeutic options, counseling and coordination of care, risks and benefits of management, compliance, or risk factor reduction   Phone  [phone] Fax [phone]

## 2024-07-22 NOTE — Patient Instructions (Signed)
 Low heart rate average 43 recommend cardiology(pacemaker? You are asymptomatic so unclear plesae discuss with pcp) or discussion with Dr. Leonel CT shows some chronic changes in the lumgs (bronchietasis) prior infections possibly? Evaluation for further atrial fibrillation monitoring such as loop recorder(below)  Implantable Loop Recorder Placement  An implantable loop recorder is a small electronic device that is placed under the skin of the chest. The device records the electrical activity of the heart over a long period of time. Your health care provider can download these recordings to monitor your heart. You may need an implantable loop recorder if you have periods of abnormal heart activity (arrhythmias) or unexplained fainting (syncope). The recorder can be left in place for as long as 3 years. Tell a health care provider about: Any allergies you have. All medicines you are taking, including vitamins, herbs, eye drops, creams, and over-the-counter medicines. Any problems you or family members have had with anesthesia. Any bleeding problems you have. Any surgeries you have had. Any medical conditions you have. Whether you are pregnant or may be pregnant. What are the risks? Your health care provider will talk with you about risks. These may include: Infection. Bleeding. Allergic reactions to anesthesia. Damage to nerves or blood vessels. Failure of the device to work. This could require another surgery to replace it. What happens before the procedure? You may have a physical exam, blood tests, and imaging tests, such as a chest X-ray. Follow instructions from your health care provider about what you may eat and drink Ask your health care provider about: Changing or stopping your regular medicines. These include any diabetes medicines or blood thinners you take. Taking medicines such as aspirin  and ibuprofen. These medicines can thin your blood. Do not take them unless your health  care provider tells you to. Taking over-the-counter medicines, vitamins, herbs, and supplements. Ask your health care provider: How your surgery site will be marked. What steps will be taken to help prevent infection. These steps may include: Removing hair at the surgery site. Washing skin with a soap that kills germs. Taking antibiotics. If you will be going home right after the procedure, plan to have a responsible adult: Take you home from the hospital or clinic. You will not be allowed to drive. Do not use any products that contain nicotine or tobacco before the procedure. These products include cigarettes, chewing tobacco, and vaping devices, such as e-cigarettes. If you need help quitting, ask your health care provider What happens during the procedure? An IV will be inserted into one of your veins. You may be given: A sedative. This helps you relax Anesthesia. This will: Numb certain areas of your body. Make you fall asleep for surgery. A small incision will be made on the left side of your upper chest. A pocket will be created under your skin. The device will be placed in the pocket. The incision will be closed with stitches (sutures) or adhesive strips. A bandage (dressing) will be placed over the incision. The procedure may vary among health care providers and hospitals. What happens after the procedure? Your blood pressure, heart rate, breathing rate, and blood oxygen level will be monitored until you leave the hospital or clinic. You may be able to go home on the day of your surgery. Before you go home: Your health care provider will program your recorder. You will learn how to trigger your device with a handheld activator. You will learn how to send recordings to your health care provider. You  will get an ID card for your device, and you will be told when to use it. This information is not intended to replace advice given to you by your health care provider. Make sure you  discuss any questions you have with your health care provider. Document Revised: 04/10/2022 Document Reviewed: 04/10/2022 Elsevier Patient Education  2024 [employer].

## 2024-07-24 DIAGNOSIS — H04123 Dry eye syndrome of bilateral lacrimal glands: Secondary | ICD-10-CM | POA: Diagnosis not present

## 2024-07-24 DIAGNOSIS — H35033 Hypertensive retinopathy, bilateral: Secondary | ICD-10-CM | POA: Diagnosis not present

## 2024-07-24 DIAGNOSIS — H35363 Drusen (degenerative) of macula, bilateral: Secondary | ICD-10-CM | POA: Diagnosis not present

## 2024-07-24 DIAGNOSIS — H40013 Open angle with borderline findings, low risk, bilateral: Secondary | ICD-10-CM | POA: Diagnosis not present

## 2024-08-11 ENCOUNTER — Other Ambulatory Visit (HOSPITAL_COMMUNITY): Payer: Self-pay

## 2024-09-04 DIAGNOSIS — E7800 Pure hypercholesterolemia, unspecified: Secondary | ICD-10-CM | POA: Diagnosis not present

## 2024-09-04 DIAGNOSIS — R2689 Other abnormalities of gait and mobility: Secondary | ICD-10-CM | POA: Diagnosis not present

## 2024-09-04 DIAGNOSIS — N1831 Chronic kidney disease, stage 3a: Secondary | ICD-10-CM | POA: Diagnosis not present

## 2024-09-04 DIAGNOSIS — M8580 Other specified disorders of bone density and structure, unspecified site: Secondary | ICD-10-CM | POA: Diagnosis not present

## 2024-09-04 DIAGNOSIS — R809 Proteinuria, unspecified: Secondary | ICD-10-CM | POA: Diagnosis not present

## 2024-09-04 DIAGNOSIS — I10 Essential (primary) hypertension: Secondary | ICD-10-CM | POA: Diagnosis not present

## 2024-09-08 DIAGNOSIS — J479 Bronchiectasis, uncomplicated: Secondary | ICD-10-CM | POA: Diagnosis not present

## 2024-09-08 DIAGNOSIS — Z8673 Personal history of transient ischemic attack (TIA), and cerebral infarction without residual deficits: Secondary | ICD-10-CM | POA: Diagnosis not present

## 2024-09-08 DIAGNOSIS — Z0000 Encounter for general adult medical examination without abnormal findings: Secondary | ICD-10-CM | POA: Diagnosis not present

## 2024-09-08 DIAGNOSIS — Z23 Encounter for immunization: Secondary | ICD-10-CM | POA: Diagnosis not present

## 2024-09-08 DIAGNOSIS — M8588 Other specified disorders of bone density and structure, other site: Secondary | ICD-10-CM | POA: Diagnosis not present

## 2024-09-08 DIAGNOSIS — R809 Proteinuria, unspecified: Secondary | ICD-10-CM | POA: Diagnosis not present

## 2024-09-08 DIAGNOSIS — I10 Essential (primary) hypertension: Secondary | ICD-10-CM | POA: Diagnosis not present

## 2024-09-08 DIAGNOSIS — N1831 Chronic kidney disease, stage 3a: Secondary | ICD-10-CM | POA: Diagnosis not present

## 2024-09-08 DIAGNOSIS — K219 Gastro-esophageal reflux disease without esophagitis: Secondary | ICD-10-CM | POA: Diagnosis not present

## 2024-09-08 DIAGNOSIS — R2689 Other abnormalities of gait and mobility: Secondary | ICD-10-CM | POA: Diagnosis not present

## 2024-09-08 DIAGNOSIS — E7800 Pure hypercholesterolemia, unspecified: Secondary | ICD-10-CM | POA: Diagnosis not present

## 2024-10-16 ENCOUNTER — Ambulatory Visit

## 2024-10-16 VITALS — BP 121/76 | HR 65 | Temp 97.7°F | Ht 66.0 in | Wt 130.0 lb

## 2024-10-16 DIAGNOSIS — J984 Other disorders of lung: Secondary | ICD-10-CM | POA: Diagnosis not present

## 2024-10-16 NOTE — Progress Notes (Signed)
 New Patient Pulmonology Office Visit   Subjective:  Patient ID: Jenny Mitchell, female    DOB: Aug 02, 1946  MRN: [id number]  Referred by: Leonel Cole, MD  CC:  Chief Complaint  Patient presents with   Consult    Abnormal ct angio neck. Patient denies having any breathing promblem.    HPI Jenny Mitchell is a 78 y.o. female who is referred for lung scarring noted in CT neck  Discussed the use of AI scribe software for clinical note transcription with the patient, who gave verbal consent to proceed.  History of Present Illness Jenny Mitchell is a 78 year old female who presents for evaluation of abnormal changes on the top part of her lung found on a scan. She was referred by her doctor due to abnormal changes noted on a lung scan.  She underwent a scan initially intended for her neck, which incidentally captured the top part of her lung, revealing minimal scarring. The scan was originally performed as part of a follow-up after an episode where she experienced vertigo and shortness of breath, leading her to seek urgent care.  No history of smoking or significant breathing difficulties. Her past medical history does not include a history of stroke, although she had one episode of vertigo and shortness of breath. She takes a low-dose aspirin  regularly.  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 She attempts to exercise daily, although she admits it is not as consistent as she would like.  No family history of autoimmune diseases such as lupus or rheumatoid arthritis, and no joint problems.       ROS Review of symptoms negative except mentioned above   Allergies: Lisinopril  Current Outpatient Medications:    aspirin  EC 81 MG tablet, Take 1 tablet (81 mg total) by mouth daily. Swallow whole., Disp: , Rfl:    bisoprolol -hydrochlorothiazide  (ZIAC ) 2.5-6.25 MG tablet, Take 1 tablet by mouth daily., Disp: 90 tablet,  Rfl: 1   famotidine  (PEPCID ) 40 MG tablet, Take 1 tablet (40 mg total) by mouth 2 (two) times daily., Disp: 180 tablet, Rfl: 3   ketoconazole  (NIZORAL ) 2 % cream, Apply 1 Application topically daily., Disp: 15 g, Rfl: 0   triamcinolone  ointment (KENALOG ) 0.1 %, Apply 1 Application topically 2 (two) times daily., Disp: 80 g, Rfl: 3 Past Medical History:  Diagnosis Date   Hypertension    Past Surgical History:  Procedure Laterality Date   ABDOMINAL HYSTERECTOMY     PARTIAL HYSTERECTOMY     Family History  Problem Relation Age of Onset   Parkinsonism Brother    BRCA 1/2 Neg Hx    Breast cancer Neg Hx    Stroke Neg Hx    Social History   Socioeconomic History   Marital status: Married    Spouse name: Not on file   Number of children: Not on file   Years of education: Not on file   Highest education level: Not on file  Occupational History   Not on file  Tobacco Use   Smoking status: Never   Smokeless tobacco: Never  Vaping Use   Vaping status: Never Used  Substance and Sexual Activity   Alcohol use: No   Drug use: No   Sexual activity: Not on file  Other Topics Concern   Not on file  Social History Narrative   Pt lives with husband    Retired    [employer]  Financial Resource Strain: Not on file  Food Insecurity: Not on file  Transportation Needs: Not on file  Physical Activity: Not on file  Stress: Not on file  Social Connections: Not on file  Intimate Partner Violence: Not on file         Objective:  BP 121/76   Pulse 65   Temp 97.7 F (36.5 C) (Oral)   Ht 5' 6 (1.676 m)   Wt 130 lb (59 kg)   SpO2 97%   BMI 20.98 kg/m    Physical Exam Constitutional:      General: She is not in acute distress.    Appearance: Normal appearance.  HENT:     Mouth/Throat:     Mouth: Mucous membranes are moist.  Cardiovascular:     Rate and Rhythm: Normal rate.  Pulmonary:     Effort: No respiratory distress.     Breath sounds: No wheezing or  rales.  Musculoskeletal:     Right lower leg: No edema.     Left lower leg: No edema.  Skin:    General: Skin is warm.  Neurological:     Mental Status: She is alert and oriented to person, place, and time.  Psychiatric:        Mood and Affect: Mood normal.     Diagnostic Review:    Pft     No data to display               Results RADIOLOGY Neck CT: Minimal scarring in the upper lung field (12/2023)  Reviewed referral notes    Assessment & Plan:   Assessment & Plan Apical lung scarring Incidental finding Pt is asymptomatic Will need to r/o ILD Discussed the symptoms, etiology, pathophysiology, diagnostic test, treatment, flare ups,  prognosis of ILD Will schedule dedicated CT chest to review Orders:   CT CHEST HIGH RESOLUTION; Future    Thank you for the opportunity to take part in the care of Jenny Mitchell   Return in about 9 weeks (around 12/18/2024).   Delaine Hernandez Pleas, MD [facility] Office: [phone]

## 2024-10-16 NOTE — Patient Instructions (Addendum)
 It was a pleasure to see you today. Your will receive a call for your CT chest scheduling. We will see you in this clinic after the CT chest

## 2024-12-16 ENCOUNTER — Ambulatory Visit: Admission: RE | Admit: 2024-12-16 | Discharge: 2024-12-16 | Disposition: A | Source: Ambulatory Visit

## 2024-12-16 DIAGNOSIS — J984 Other disorders of lung: Secondary | ICD-10-CM

## 2024-12-17 ENCOUNTER — Telehealth: Payer: Self-pay

## 2024-12-17 NOTE — Telephone Encounter (Signed)
 Copied from CRM #[id number]. Topic: Clinical - Lab/Test Results >> Dec 17, 2024  9:07 AM Corean SAUNDERS wrote: Reason for CRM: Call report from Dianne at [facility] imaging - patients chest CT from 1/20 is now completed and read, available in patients chart.    Noted, NFN.

## 2024-12-18 ENCOUNTER — Ambulatory Visit: Payer: Self-pay

## 2024-12-18 DIAGNOSIS — J984 Other disorders of lung: Secondary | ICD-10-CM

## 2025-01-13 ENCOUNTER — Other Ambulatory Visit (HOSPITAL_BASED_OUTPATIENT_CLINIC_OR_DEPARTMENT_OTHER)
